# Patient Record
Sex: Female | Born: 1955 | Race: White | Hispanic: No | Smoking: Former smoker
Health system: Southern US, Community
[De-identification: ages and names within clinical notes are randomized; demographics above are authoritative.]

## PROBLEM LIST (undated history)

## (undated) DIAGNOSIS — I10 Essential (primary) hypertension: Secondary | ICD-10-CM

## (undated) DIAGNOSIS — R51 Headache: Secondary | ICD-10-CM

## (undated) DIAGNOSIS — R000 Tachycardia, unspecified: Secondary | ICD-10-CM

## (undated) DIAGNOSIS — F419 Anxiety disorder, unspecified: Secondary | ICD-10-CM

## (undated) HISTORY — DX: Headache: R51

## (undated) HISTORY — DX: Anxiety disorder, unspecified: F41.9

## (undated) HISTORY — PX: CHOLECYSTECTOMY: SHX55

## (undated) HISTORY — DX: Essential (primary) hypertension: I10

---

## 1999-05-14 ENCOUNTER — Other Ambulatory Visit: Admission: RE | Admit: 1999-05-14 | Discharge: 1999-05-14 | Payer: Self-pay | Admitting: Gynecology

## 2000-02-03 ENCOUNTER — Other Ambulatory Visit: Admission: RE | Admit: 2000-02-03 | Discharge: 2000-02-03 | Payer: Self-pay | Admitting: Gynecology

## 2000-09-28 ENCOUNTER — Encounter: Admission: RE | Admit: 2000-09-28 | Discharge: 2000-09-28 | Payer: Self-pay | Admitting: Gynecology

## 2000-09-28 ENCOUNTER — Encounter: Payer: Self-pay | Admitting: Gynecology

## 2000-12-01 ENCOUNTER — Encounter: Payer: Self-pay | Admitting: *Deleted

## 2000-12-01 ENCOUNTER — Emergency Department (HOSPITAL_COMMUNITY): Admission: EM | Admit: 2000-12-01 | Discharge: 2000-12-01 | Payer: Self-pay | Admitting: *Deleted

## 2000-12-02 ENCOUNTER — Other Ambulatory Visit: Admission: RE | Admit: 2000-12-02 | Discharge: 2000-12-02 | Payer: Self-pay | Admitting: Gynecology

## 2001-12-22 ENCOUNTER — Encounter: Payer: Self-pay | Admitting: Family Medicine

## 2001-12-22 ENCOUNTER — Encounter: Admission: RE | Admit: 2001-12-22 | Discharge: 2001-12-22 | Payer: Self-pay | Admitting: Family Medicine

## 2002-07-18 ENCOUNTER — Emergency Department (HOSPITAL_COMMUNITY): Admission: EM | Admit: 2002-07-18 | Discharge: 2002-07-18 | Payer: Self-pay | Admitting: Emergency Medicine

## 2002-07-18 ENCOUNTER — Encounter: Payer: Self-pay | Admitting: Emergency Medicine

## 2002-10-24 ENCOUNTER — Other Ambulatory Visit: Admission: RE | Admit: 2002-10-24 | Discharge: 2002-10-24 | Payer: Self-pay | Admitting: Obstetrics and Gynecology

## 2003-02-14 ENCOUNTER — Encounter: Payer: Self-pay | Admitting: Obstetrics and Gynecology

## 2003-02-14 ENCOUNTER — Encounter: Admission: RE | Admit: 2003-02-14 | Discharge: 2003-02-14 | Payer: Self-pay | Admitting: Obstetrics and Gynecology

## 2003-04-04 ENCOUNTER — Encounter: Payer: Self-pay | Admitting: Cardiology

## 2003-04-04 ENCOUNTER — Ambulatory Visit (HOSPITAL_COMMUNITY): Admission: RE | Admit: 2003-04-04 | Discharge: 2003-04-04 | Payer: Self-pay | Admitting: Cardiology

## 2004-01-03 ENCOUNTER — Ambulatory Visit (HOSPITAL_COMMUNITY): Admission: RE | Admit: 2004-01-03 | Discharge: 2004-01-03 | Payer: Self-pay | Admitting: Neurology

## 2005-04-08 ENCOUNTER — Encounter: Admission: RE | Admit: 2005-04-08 | Discharge: 2005-04-08 | Payer: Self-pay | Admitting: Obstetrics and Gynecology

## 2005-05-27 ENCOUNTER — Other Ambulatory Visit: Admission: RE | Admit: 2005-05-27 | Discharge: 2005-05-27 | Payer: Self-pay | Admitting: Dermatology

## 2006-07-20 ENCOUNTER — Ambulatory Visit: Payer: Self-pay | Admitting: Cardiology

## 2006-07-20 LAB — CONVERTED CEMR LAB
BUN: 15 mg/dL (ref 6–23)
CO2: 28 meq/L (ref 19–32)
Calcium: 9.5 mg/dL (ref 8.4–10.5)
Glucose, Bld: 114 mg/dL — ABNORMAL HIGH (ref 70–99)
Potassium: 3.6 meq/L (ref 3.5–5.1)
Sodium: 142 meq/L (ref 135–145)
TSH: 0.79 microintl units/mL (ref 0.35–5.50)

## 2006-07-27 ENCOUNTER — Encounter: Payer: Self-pay | Admitting: Cardiology

## 2006-07-27 ENCOUNTER — Ambulatory Visit: Payer: Self-pay

## 2006-11-24 ENCOUNTER — Encounter: Admission: RE | Admit: 2006-11-24 | Discharge: 2006-11-24 | Payer: Self-pay | Admitting: Obstetrics and Gynecology

## 2007-03-07 ENCOUNTER — Other Ambulatory Visit: Admission: RE | Admit: 2007-03-07 | Discharge: 2007-03-07 | Payer: Self-pay | Admitting: Obstetrics and Gynecology

## 2007-05-18 ENCOUNTER — Ambulatory Visit (HOSPITAL_COMMUNITY): Admission: RE | Admit: 2007-05-18 | Discharge: 2007-05-18 | Payer: Self-pay | Admitting: Family Medicine

## 2007-07-11 ENCOUNTER — Ambulatory Visit: Payer: Self-pay | Admitting: Cardiology

## 2007-08-14 ENCOUNTER — Emergency Department (HOSPITAL_COMMUNITY): Admission: EM | Admit: 2007-08-14 | Discharge: 2007-08-14 | Payer: Self-pay | Admitting: Emergency Medicine

## 2008-07-24 ENCOUNTER — Emergency Department (HOSPITAL_COMMUNITY): Admission: EM | Admit: 2008-07-24 | Discharge: 2008-07-24 | Payer: Self-pay | Admitting: Emergency Medicine

## 2009-01-04 ENCOUNTER — Telehealth: Payer: Self-pay | Admitting: Cardiology

## 2009-01-26 ENCOUNTER — Emergency Department (HOSPITAL_COMMUNITY): Admission: EM | Admit: 2009-01-26 | Discharge: 2009-01-26 | Payer: Self-pay | Admitting: Emergency Medicine

## 2009-01-29 ENCOUNTER — Encounter (INDEPENDENT_AMBULATORY_CARE_PROVIDER_SITE_OTHER): Payer: Self-pay | Admitting: *Deleted

## 2009-12-26 ENCOUNTER — Ambulatory Visit (HOSPITAL_COMMUNITY): Admission: RE | Admit: 2009-12-26 | Discharge: 2009-12-26 | Payer: Self-pay | Admitting: Gastroenterology

## 2010-01-27 ENCOUNTER — Encounter: Admission: RE | Admit: 2010-01-27 | Discharge: 2010-01-27 | Payer: Self-pay | Admitting: Family Medicine

## 2010-09-24 ENCOUNTER — Other Ambulatory Visit (HOSPITAL_COMMUNITY): Payer: Self-pay | Admitting: Family Medicine

## 2010-09-24 DIAGNOSIS — R519 Headache, unspecified: Secondary | ICD-10-CM

## 2010-09-27 LAB — URINE CULTURE: Colony Count: NO GROWTH

## 2010-09-27 LAB — DIFFERENTIAL
Basophils Relative: 0 % (ref 0–1)
Lymphocytes Relative: 17 % (ref 12–46)
Monocytes Relative: 10 % (ref 3–12)
Neutro Abs: 3.5 10*3/uL (ref 1.7–7.7)

## 2010-09-27 LAB — URINALYSIS, ROUTINE W REFLEX MICROSCOPIC
Nitrite: NEGATIVE
Protein, ur: NEGATIVE mg/dL

## 2010-09-27 LAB — CBC
HCT: 35.7 % — ABNORMAL LOW (ref 36.0–46.0)
Hemoglobin: 12.6 g/dL (ref 12.0–15.0)
MCHC: 35.4 g/dL (ref 30.0–36.0)
Platelets: 236 10*3/uL (ref 150–400)
RBC: 3.93 MIL/uL (ref 3.87–5.11)
RDW: 12.6 % (ref 11.5–15.5)

## 2010-09-27 LAB — COMPREHENSIVE METABOLIC PANEL
Albumin: 3.6 g/dL (ref 3.5–5.2)
Alkaline Phosphatase: 48 U/L (ref 39–117)
BUN: 12 mg/dL (ref 6–23)
CO2: 26 mEq/L (ref 19–32)
Chloride: 106 mEq/L (ref 96–112)
Creatinine, Ser: 0.66 mg/dL (ref 0.4–1.2)
Potassium: 3.6 mEq/L (ref 3.5–5.1)
Sodium: 137 mEq/L (ref 135–145)
Total Bilirubin: 0.5 mg/dL (ref 0.3–1.2)

## 2010-09-27 LAB — LIPASE, BLOOD: Lipase: 16 U/L (ref 11–59)

## 2010-10-02 ENCOUNTER — Ambulatory Visit (HOSPITAL_COMMUNITY): Payer: Self-pay

## 2010-11-04 NOTE — Assessment & Plan Note (Signed)
[facility]                            CARDIOLOGY OFFICE NOTE   NAME:Yvonne [name], Yvonne [name]                    MRN:          [id number]  DATE:07/11/2007                            DOB:          1955-07-22    PRIMARY CARE PHYSICIAN:  None.   REASON FOR PRESENTATION:  Evaluate patient with palpitations.   HISTORY OF PRESENT ILLNESS:  The patient is 55 years old.  When I last  saw her here last year, she was complaining of palpitations.  We  discussed avoiding caffeine completely and increasing her beta blocker;  however, she says that within two to three weeks of that visit, they all  went away.  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  I could not do this without seeing her.   She says now she is not having any palpitations.  She has no pre-syncope  or syncope.  She denies any chest discomfort, neck or arm discomfort.  She has no shortness of breath.  She is quite active.   PAST MEDICAL HISTORY:  1. Questionable TIA.  2. Palpitations.  3. Atypical chest pain.  4. Hypertension.  5. Anxiety disorder.  6. Previous tobacco use.   ALLERGIES:  HYDROCODONE.   CURRENT MEDICATIONS:  1. Metoprolol 50 mg twice daily.  2. Aspirin 81 mg daily.   REVIEW OF SYSTEMS:  As stated in the HPI and otherwise negative for  other systems.   PHYSICAL EXAMINATION:  GENERAL:  The patient is in no distress.  VITAL SIGNS:  Blood pressure 128/79, heart rate 72 and regular.  HEENT:  Eyes:  Unremarkable.  Pupils equal, round, reactive to light.  Fundi not visualized.  NECK:  No jugular venous distention at 45 degrees.  Carotid upstroke  brisk and symmetric.  No bruits, no thyromegaly.  LUNGS:  Clear to auscultation bilaterally.  CHEST:  Unremarkable.  HEART:  PMI not displaced or sustained.  S1 and S2 within normal limits.  No S3, no S4, no clicks, rubs or murmurs.  ABDOMEN:  Flat with positive bowel sounds, normal in frequency  and  pitch.  No bruits, no rebound, no guarding, no midline pulsatile mass,  no organomegaly.  SKIN:  No rashes.  EXTREMITIES:  With 2+ pulses, no edema.   ASSESSMENT/PLAN:  1. Palpitations:  The patient is no longer bothered by these.  No      further cardiovascular testing is suggested.  She will continue      with the beta blocker on a twice daily dosing.  2. Hypertension:  The blood pressure is well-controlled.  She is to      continue with the medications as listed.   FOLLOWUP:  She will come back to this clinic as needed.     Rollene Rotunda, MD, [facility]  Electronically Signed    JH/MedQ  DD: 07/11/2007  DT: 07/11/2007  Job #: [id number]

## 2010-11-07 ENCOUNTER — Other Ambulatory Visit (HOSPITAL_COMMUNITY): Payer: Self-pay | Admitting: Family Medicine

## 2010-11-07 ENCOUNTER — Ambulatory Visit (HOSPITAL_COMMUNITY)
Admission: RE | Admit: 2010-11-07 | Discharge: 2010-11-07 | Disposition: A | Discharge status: Home / Self Care | Payer: Self-pay | Source: Ambulatory Visit | Attending: Family Medicine | Admitting: Family Medicine

## 2010-11-07 DIAGNOSIS — R519 Headache, unspecified: Secondary | ICD-10-CM

## 2010-11-07 DIAGNOSIS — R51 Headache: Secondary | ICD-10-CM | POA: Insufficient documentation

## 2010-11-07 NOTE — Assessment & Plan Note (Signed)
[facility]                            CARDIOLOGY OFFICE NOTE   NAME:Yvonne [name]                    MRN:          [id number]  DATE:07/20/2006                            DOB:          06-Jun-1956    PRIMARY:  None.   REASON FOR PRESENTATION:  Evaluate patient with palpitations.   HISTORY OF PRESENT ILLNESS:  The patient is now 55 years old.  I have  not seen her in about 2-1/2 years.  She had a long history of  palpitations and chest discomfort.  She has been managed with beta-  blockers with success.  This really improved her palpitations.  She was  having these rarely up until about 2-3 weeks ago.  She has now been  having them daily.  She notices skipped beats.  These are not sustained  tachyarrhythmias, by her description.  There has been no presyncope or  syncope.  She notices them and they are quite bothersome.  They take her  breath away.  She had one episode of chest discomfort about a month ago  but otherwise does not get any chest pressure or neck discomfort.  She  has no PND or orthopnea.   Of note, the patient does drink one large caffeinated tea every day and  eats a lot of chocolate.   PAST MEDICAL HISTORY:  1. Questionable TIA.  2. Palpitations.  3. Atypical chest pain.  4. Hypertension.  5. Anxiety disorder.  6. Previous tobacco use.   ALLERGIES:  HYDROCODONE.   MEDICATIONS:  1. Metoprolol 50 mg b.i.d.  2. Aspirin 81 mg daily.   REVIEW OF SYSTEMS:  As stated in the HPI and otherwise negative for  other systems.   PHYSICAL EXAMINATION:  Patient is in no distress.  Blood pressure  134/89, heart rate 84 and regular, weight 142 pounds, body mass index  24.  HEENT:  Eyes unremarkable, pupils equal, round, react to light, fundi  not visualized, oral mucosa unremarkable.  NECK:  No jugular venous distention, wave form within normal limits,  carotid upstroke brisk and symmetric, no bruits, no thyromegaly.  LYMPHATICS:   No cervical, axillary or inguinal adenopathy.  LUNGS:  Clear to auscultation bilaterally.  BACK:  No costovertebral angle tenderness.  CHEST:  Unremarkable.  HEART:  The PMI not displaced or sustained, S1 and S2 within normal  limits, no S3, no S4, no murmurs.  ABDOMEN:  Flat, positive bowel sounds normal in frequency and pitch, no  bruits, no rebound, no guarding or midline pulse, no mass, no  hepatomegaly, no splenomegaly.  SKIN:  No rashes, no nodules.  EXTREMITIES:  Pulses 2+, no cyanosis, no clubbing, no edema.  NEURO:  Oriented to person, place and time, cranial nerves II-XII  grossly intact, motor grossly intact throughout.   EKG:  Sinus rhythm, premature ventricular complexes, axis within normal  limits, intervals within normal limits, no acute ST-T wave changes.   ASSESSMENT AND PLAN:  1. Palpitations.  The patient is having increased palpitations.  We      discussed this at great length.  She will stop all caffeine  including chocolate.  I am going to increase her metoprolol to 50      mg three times daily.  I am going to get blood work to include a      TSH, magnesium and CBC.  If these conservative measures do not work      the next step might be a calcium channel blocker and finally a      Class 1C agent.  I am going to check an echocardiogram to make sure      there has been no change in her cardiac function.  2. Followup.  I would like to see her back in about 1 month to follow      up on this.  She knows to come to the emergency room should she      have any increasing symptoms, presyncope or syncope.     Rollene Rotunda, MD, [facility]  Electronically Signed    JH/MedQ  DD: 07/20/2006  DT: 07/20/2006  Job #: [id number]

## 2011-08-09 ENCOUNTER — Emergency Department (HOSPITAL_COMMUNITY)
Admission: EM | Admit: 2011-08-09 | Discharge: 2011-08-09 | Disposition: A | Discharge status: Home / Self Care | Payer: Worker's Compensation | Attending: Emergency Medicine | Admitting: Emergency Medicine

## 2011-08-09 ENCOUNTER — Encounter (HOSPITAL_COMMUNITY): Payer: Self-pay

## 2011-08-09 DIAGNOSIS — R209 Unspecified disturbances of skin sensation: Secondary | ICD-10-CM | POA: Insufficient documentation

## 2011-08-09 DIAGNOSIS — L089 Local infection of the skin and subcutaneous tissue, unspecified: Secondary | ICD-10-CM

## 2011-08-09 DIAGNOSIS — L03019 Cellulitis of unspecified finger: Secondary | ICD-10-CM | POA: Insufficient documentation

## 2011-08-09 DIAGNOSIS — L02519 Cutaneous abscess of unspecified hand: Secondary | ICD-10-CM | POA: Insufficient documentation

## 2011-08-09 HISTORY — DX: Tachycardia, unspecified: R00.0

## 2011-08-09 MED ORDER — DOXYCYCLINE HYCLATE 100 MG PO TABS
100.0000 mg | ORAL_TABLET | Freq: Once | ORAL | Status: AC
  Administered 2011-08-09: 100 mg via ORAL
  Filled 2011-08-09: qty 1

## 2011-08-09 MED ORDER — IBUPROFEN 800 MG PO TABS
800.0000 mg | ORAL_TABLET | Freq: Once | ORAL | Status: AC
  Administered 2011-08-09: 800 mg via ORAL
  Filled 2011-08-09: qty 1

## 2011-08-09 MED ORDER — ALPRAZOLAM 0.5 MG PO TABS
0.5000 mg | ORAL_TABLET | Freq: Once | ORAL | Status: AC
  Administered 2011-08-09: 0.5 mg via ORAL
  Filled 2011-08-09: qty 1

## 2011-08-09 MED ORDER — DOXYCYCLINE HYCLATE 100 MG PO CAPS: 100.0000 mg | ORAL_CAPSULE | Freq: Two times a day (BID) | ORAL | Status: AC

## 2011-08-09 MED ORDER — TRAMADOL HCL 50 MG PO TABS: 50.0000 mg | ORAL_TABLET | Freq: Four times a day (QID) | ORAL | Status: AC | PRN

## 2011-08-09 MED ORDER — IBUPROFEN 600 MG PO TABS: 600.0000 mg | ORAL_TABLET | Freq: Four times a day (QID) | ORAL | Status: AC | PRN

## 2011-08-09 MED ORDER — LIDOCAINE HCL (PF) 2 % IJ SOLN
10.0000 mL | Freq: Once | INTRAMUSCULAR | Status: AC
  Administered 2011-08-09: 10 mL
  Filled 2011-08-09: qty 10

## 2011-08-09 NOTE — ED Notes (Signed)
Pt presents with right middle finger swelling and pain. Pt states she got a toothpick stuck in finger yesterday.

## 2011-08-09 NOTE — Discharge Instructions (Signed)
Skin Infections A skin infection usually develops as a result of disruption of the skin barrier.  CAUSES  A skin infection might occur following:  Trauma or an injury to the skin such as a cut or insect sting.   Inflammation (as in eczema).   Breaks in the skin between the toes (as in athlete's foot).   Swelling (edema).  SYMPTOMS  The legs are the most common site affected. Usually there is:  Redness.   Swelling.   Pain.   There may be red streaks in the area of the infection.  TREATMENT   Minor skin infections may be treated with topical antibiotics, but if the skin infection is severe, hospital care and intravenous (IV) antibiotic treatment may be needed.   Most often skin infections can be treated with oral antibiotic medicine as well as proper rest and elevation of the affected area until the infection improves.   If you are prescribed oral antibiotics, it is important to take them as directed and to take all the pills even if you feel better before you have finished all of the medicine.   You may apply warm compresses to the area for 20-30 minutes 4 times daily.  You might need a tetanus shot now if:  You have no idea when you had the last one.   You have never had a tetanus shot before.   Your wound had dirt in it.  If you need a tetanus shot and you decide not to get one, there is a rare chance of getting tetanus. Sickness from tetanus can be serious. If you get a tetanus shot, your arm may swell and become red and warm at the shot site. This is common and not a problem. SEEK MEDICAL CARE IF:  The pain and swelling from your infection do not improve within 2 days.  SEEK IMMEDIATE MEDICAL CARE IF:  You develop a fever, chills, or other serious problems.  Document Released: 07/16/2004 Document Revised: 02/18/2011 Document Reviewed: 05/28/2008 [facility] Patient Information 2012 [place], [place].   Take your next dose of antibiotics tomorrow morning.  Start warm Epsom  salt soaks for 15 minutes 3-4 times daily.

## 2011-08-09 NOTE — ED Notes (Signed)
Pt left without workers comp form, tried to explain to pt to wait for form but pt left anyway and refused to listen to nurse on waiting for form

## 2011-08-09 NOTE — ED Notes (Signed)
Pt here for puncture wound to tip of right middle finger, states that a toothpick puncture her finger when she bent down to clean, very swollen and inflamed, red in color, states pain is throbbing, pt had been picking at finger with nail clippers when nurse entered room

## 2011-08-09 NOTE — ED Provider Notes (Signed)
History     CSN: [id number]  Arrival date & time 08/09/11  1628   First MD Initiated Contact with Patient 08/09/11 1655      Chief Complaint  Patient presents with  . Finger Injury    (Consider location/radiation/quality/duration/timing/severity/associated sxs/prior treatment) Patient is a 56 y.o. female presenting with hand pain. The history is provided by the patient.  Hand Pain This is a new (Patients right middle finger tip was stuck by a toothpick yesterday when she reached under a trash compactor at the home she was cleaning.) problem. The current episode started yesterday. The problem occurs constantly. The problem has been unchanged. Associated symptoms include numbness. Pertinent negatives include no arthralgias, chest pain, congestion, fever, headaches, joint swelling or rash. Associated symptoms comments: Swelling,  Redness and pain.. The symptoms are aggravated by bending (palpating). She has tried nothing for the symptoms.    Past Medical History  Diagnosis Date  . Pneumothorax   . Tachycardia     Past Surgical History  Procedure Date  . Cholecystectomy     No family history on file.  History  Substance Use Topics  . Smoking status: Never Smoker   . Smokeless tobacco: Not on file  . Alcohol Use: No    OB History    Grav Para Term Preterm Abortions TAB SAB Ect Mult Living                  Review of Systems  Constitutional: Negative for fever.  HENT: Negative for congestion.   Eyes: Negative.   Respiratory: Negative for shortness of breath.   Cardiovascular: Negative for chest pain.  Gastrointestinal: Negative.   Genitourinary: Negative.   Musculoskeletal: Negative for joint swelling and arthralgias.  Skin: Positive for color change and wound. Negative for rash.  Neurological: Positive for numbness. Negative for light-headedness and headaches.       The fingertip feels slightly numb.  Hematological: Negative.   Psychiatric/Behavioral: Negative.       Allergies  Hydrocodone  Home Medications   Current Outpatient Rx  Name Route Sig Dispense Refill  . DOXYCYCLINE HYCLATE 100 MG PO CAPS Oral Take 1 capsule (100 mg total) by mouth 2 (two) times daily. 20 capsule 0  . IBUPROFEN 600 MG PO TABS Oral Take 1 tablet (600 mg total) by mouth every 6 (six) hours as needed for pain. 30 tablet 0  . TRAMADOL HCL 50 MG PO TABS Oral Take 1 tablet (50 mg total) by mouth every 6 (six) hours as needed for pain. 15 tablet 0    BP 140/82  Pulse 77  Temp(Src) 98.2 F (36.8 C) (Oral)  Resp 20  Ht 5\' 4"  (1.626 m)  Wt 140 lb (63.504 kg)  BMI 24.03 kg/m2  SpO2 100%  Physical Exam  Nursing note and vitals reviewed. Constitutional: She is oriented to person, place, and time. She appears well-developed and well-nourished.  HENT:  Head: Normocephalic and atraumatic.  Eyes: Conjunctivae are normal.  Neck: Normal range of motion.  Cardiovascular: Normal rate, regular rhythm and intact distal pulses.   Pulmonary/Chest: Effort normal.  Musculoskeletal: Normal range of motion.  Neurological: She is alert and oriented to person, place, and time.  Skin: Skin is warm and dry. There is erythema.       Small puncture wound right 3rd distal volar finger with edema and erythema.  No fluctuance.  TTP.  Tiny locus of yellow coloration beneath puncture, no drainage.  Psychiatric: She has a normal mood and affect.  ED Course  INCISION AND DRAINAGE Performed by: Cody Albus L Authorized by: Burgess Amor L Consent: Verbal consent obtained. Risks and benefits: risks, benefits and alternatives were discussed Consent given by: patient Time out: Immediately prior to procedure a "time out" was called to verify the correct patient, procedure, equipment, support staff and site/side marked as required. Type: abscess Anesthesia: digital block Local anesthetic: lidocaine 2% without epinephrine Anesthetic total: 3 ml Scalpel size: 11 Incision type: single straight  (3 mm incision through puncture site.) Complexity: simple Drainage: bloody and purulent Drainage amount: scant Wound treatment: wound left open Patient tolerance: Patient tolerated the procedure well with no immediate complications.   (including critical care time)  Labs Reviewed - No data to display No results found.   1. Finger infection       MDM  Doxycycline,  Tramadol.  Warm soaks.  Recheck by pcp in 2 days.        Candis Musa, PA 08/09/11 2102

## 2011-08-10 NOTE — ED Provider Notes (Signed)
Medical screening examination/treatment/procedure(s) were performed by non-physician practitioner and as supervising physician I was immediately available for consultation/collaboration.   Shelda Jakes, MD 08/10/11 [phone]

## 2012-10-17 ENCOUNTER — Telehealth: Payer: Self-pay | Admitting: Family Medicine

## 2012-10-17 NOTE — Telephone Encounter (Signed)
Patient has a place left breast that's swollen and painful, may be a lymph node, [facility] told patient we could order a diagnostic mammogram with ultrasound of the left breast.  If "OK", please fax order to [phone].  Patient is scheduled here Monday  10/24/12 with Eber Jones for a physical.

## 2012-10-17 NOTE — Telephone Encounter (Signed)
OK, plz order

## 2012-10-18 ENCOUNTER — Other Ambulatory Visit: Payer: Self-pay | Admitting: Family Medicine

## 2012-10-18 ENCOUNTER — Encounter: Payer: Self-pay | Admitting: *Deleted

## 2012-10-18 DIAGNOSIS — N63 Unspecified lump in unspecified breast: Secondary | ICD-10-CM

## 2012-10-18 DIAGNOSIS — N644 Mastodynia: Secondary | ICD-10-CM

## 2012-10-18 NOTE — Telephone Encounter (Signed)
[facility] aware [facility]

## 2012-10-18 NOTE — Telephone Encounter (Signed)
Order faxed to the [facility] center for diagnostic mammogram with ultrasound of left breast. [facility] to notify pt.

## 2012-10-19 ENCOUNTER — Telehealth: Payer: Self-pay | Admitting: Family Medicine

## 2012-10-19 NOTE — Telephone Encounter (Signed)
Patient left me a voicemail message requesting a referral to [facility].  I called her back and left her a voicemail message explaining that she would need to discuss that with Eber Jones on 10/24/12 @ her PE appointment, that Eber Jones MIGHT want to order labs or some type of scan depending on frequency, severity, and location of headaches, and that the specialist needs documentation to have for review.

## 2012-10-24 ENCOUNTER — Other Ambulatory Visit: Payer: Self-pay | Admitting: Nurse Practitioner

## 2012-10-24 ENCOUNTER — Ambulatory Visit (INDEPENDENT_AMBULATORY_CARE_PROVIDER_SITE_OTHER): Payer: BC Managed Care – PPO | Admitting: Nurse Practitioner

## 2012-10-24 ENCOUNTER — Encounter: Payer: Self-pay | Admitting: Nurse Practitioner

## 2012-10-24 VITALS — BP 122/80 | Ht 62.5 in | Wt 155.4 lb

## 2012-10-24 DIAGNOSIS — R5381 Other malaise: Secondary | ICD-10-CM

## 2012-10-24 DIAGNOSIS — Z124 Encounter for screening for malignant neoplasm of cervix: Secondary | ICD-10-CM

## 2012-10-24 DIAGNOSIS — Z79899 Other long term (current) drug therapy: Secondary | ICD-10-CM

## 2012-10-24 DIAGNOSIS — Z0000 Encounter for general adult medical examination without abnormal findings: Secondary | ICD-10-CM

## 2012-10-26 LAB — PAP IG W/ RFLX HPV ASCU

## 2012-10-27 ENCOUNTER — Encounter: Payer: Self-pay | Admitting: Nurse Practitioner

## 2012-10-27 DIAGNOSIS — F419 Anxiety disorder, unspecified: Secondary | ICD-10-CM | POA: Insufficient documentation

## 2012-10-27 DIAGNOSIS — I10 Essential (primary) hypertension: Secondary | ICD-10-CM | POA: Insufficient documentation

## 2012-10-27 NOTE — Progress Notes (Signed)
  Subjective:    Patient ID: Yvonne [name], female    DOB: September 22, 1955, 57 y.o.   MRN: [id number]  HPI presents for her wellness exam. Has had off-and-on menstrual cycles, did not have one for 3 months and then had one 2 months ago that lasted about 5 days with slightly heavy flow. Most the time her cycles are very light. Has not had an eye or dental exam in a while, just started back on her insurance. Is scheduled for a mammogram at the end of the week. Has not had her colonoscopy. No new sexual partners. No vaginal discharge pelvic pain. At end of visit patient requesting medication for worms. See previous notes. All of her cultures have been negative. This is been established as more of a psychological issue, medication refill was denied.    Review of Systems  Constitutional: Negative for activity change and appetite change.  Eyes: Negative for visual disturbance.  Respiratory: Negative for chest tightness and shortness of breath.   Cardiovascular: Negative for chest pain, palpitations and leg swelling.  Gastrointestinal: Negative for abdominal pain, diarrhea, constipation and abdominal distention.  Genitourinary: Negative for dysuria, urgency, frequency, vaginal discharge, difficulty urinating, menstrual problem and pelvic pain.  Psychiatric/Behavioral: Negative for sleep disturbance.       Objective:   Physical Exam  Constitutional: She is oriented to person, place, and time. She appears well-developed. No distress.  HENT:  Right Ear: External ear normal.  Left Ear: External ear normal.  Mouth/Throat: Oropharynx is clear and moist.  Neck: Normal range of motion. Neck supple. No tracheal deviation present. No thyromegaly present.  Cardiovascular: Normal rate, regular rhythm and normal heart sounds.  Exam reveals no gallop.   No murmur heard. Pulmonary/Chest: Effort normal and breath sounds normal.  Abdominal: Soft. She exhibits no distension. There is no tenderness.   Genitourinary: Vagina normal and uterus normal. No vaginal discharge found.  Musculoskeletal: She exhibits no edema.  Lymphadenopathy:    She has no cervical adenopathy.  Neurological: She is alert and oriented to person, place, and time.  Skin: Skin is warm and dry. No rash noted.  Psychiatric: She has a normal mood and affect. Her behavior is normal.   breast exam normal limit, no masses noted. Axilla no adenopathy. EGBUS normal. Vagina minimal cloudy discharge. No CMT. Uterus and adnexa normal limit and nontender.     Assessment & Plan:  Routine general medical examination at a health care facility - Plan: Lipid panel, Pap IG w/ reflex to HPV when ASC-U  Other malaise and fatigue - Plan: Basic metabolic panel, TSH, Vitamin D 25 hydroxy  Encounter for long-term (current) use of other medications - Plan: Hepatic function panel  Pap smear for cervical cancer screening - Plan: Pap IG w/ reflex to HPV when ASC-U  at this point recommended patient have a cycle at least every 3 months. To call us if she goes longer than 3 months without a menstrual cycle. Given information on colonoscopy so she can call to make her appointment. Strongly recommend an eye exam and dental care in the near future. Recheck 6 months, call back sooner if any problems.

## 2012-10-28 ENCOUNTER — Ambulatory Visit
Admission: RE | Admit: 2012-10-28 | Discharge: 2012-10-28 | Disposition: A | Discharge status: Home / Self Care | Payer: BC Managed Care – PPO | Source: Ambulatory Visit | Attending: Family Medicine | Admitting: Family Medicine

## 2012-10-28 DIAGNOSIS — N644 Mastodynia: Secondary | ICD-10-CM

## 2012-10-28 DIAGNOSIS — N63 Unspecified lump in unspecified breast: Secondary | ICD-10-CM

## 2012-10-31 LAB — LIPID PANEL: Cholesterol: 171 mg/dL (ref 0–200)

## 2012-10-31 LAB — HEPATIC FUNCTION PANEL
ALT: 16 U/L (ref 0–35)
AST: 20 U/L (ref 0–37)
Indirect Bilirubin: 0.7 mg/dL (ref 0.0–0.9)

## 2012-10-31 LAB — BASIC METABOLIC PANEL
BUN: 12 mg/dL (ref 6–23)
CO2: 28 mEq/L (ref 19–32)
Calcium: 9.6 mg/dL (ref 8.4–10.5)
Chloride: 103 mEq/L (ref 96–112)
Creat: 0.7 mg/dL (ref 0.50–1.10)
Potassium: 4.1 mEq/L (ref 3.5–5.3)
Sodium: 139 mEq/L (ref 135–145)

## 2012-11-01 ENCOUNTER — Encounter: Payer: Self-pay | Admitting: Family Medicine

## 2012-11-01 LAB — VITAMIN D 25 HYDROXY (VIT D DEFICIENCY, FRACTURES): Vit D, 25-Hydroxy: 38 ng/mL (ref 30–89)

## 2012-12-22 ENCOUNTER — Other Ambulatory Visit: Payer: Self-pay

## 2012-12-22 ENCOUNTER — Other Ambulatory Visit: Payer: Self-pay | Admitting: Family Medicine

## 2012-12-22 MED ORDER — ESTROGENS CONJUGATED 0.3 MG PO TABS: 0.3000 mg | ORAL_TABLET | Freq: Every day | ORAL | Status: DC

## 2013-01-06 ENCOUNTER — Encounter: Payer: Self-pay | Admitting: Family Medicine

## 2013-01-06 ENCOUNTER — Ambulatory Visit (INDEPENDENT_AMBULATORY_CARE_PROVIDER_SITE_OTHER): Payer: BC Managed Care – PPO | Admitting: Family Medicine

## 2013-01-06 VITALS — BP 132/80 | Temp 97.6°F | Wt 152.8 lb

## 2013-01-06 DIAGNOSIS — J329 Chronic sinusitis, unspecified: Secondary | ICD-10-CM

## 2013-01-06 MED ORDER — AZITHROMYCIN 250 MG PO TABS: ORAL_TABLET | ORAL | Status: DC

## 2013-01-06 NOTE — Progress Notes (Signed)
  Subjective:    Patient ID: Yvonne [name], female    DOB: 29-Oct-1955, 57 y.o.   MRN: [id number]  HPI Decreased energy weak  Persistent drainge, constant productive cough  achey all over  Feels weak,  No obvious fever. Low dose premarin .3 daily. Patient to one and to take it but it is too costly. Wonders if there are less expensive estrogens and would like one called and     Review of Systems No vomiting no diarrhea no change in bowel habits no tick bite ROS otherwise negative    Objective:   Physical Exam Alert talkative no acute distress. Lungs clear. Heart regular rate rhythm. HEENT significant nasal congestion frontal tenderness.       Assessment & Plan:  Impression post viral sinusitis/bronchitis. #2 estrogen supplement. Patient has severe hot flashes. Definitely wants to go on estrogen supplement. Aware there are side effects some serious. Plan Z-Pak. Will research weeks to expensive estrogens alternative. WSL

## 2013-01-09 ENCOUNTER — Other Ambulatory Visit: Payer: Self-pay | Admitting: *Deleted

## 2013-01-09 MED ORDER — ESTRADIOL 0.5 MG PO TABS: 0.5000 mg | ORAL_TABLET | Freq: Every day | ORAL | Status: DC

## 2013-01-23 ENCOUNTER — Telehealth: Payer: Self-pay | Admitting: Family Medicine

## 2013-01-23 NOTE — Telephone Encounter (Signed)
Referral sent to [facility], they call pt to set up, LMOVM to notify pt

## 2013-01-23 NOTE — Telephone Encounter (Signed)
I have no note of this since she was in for wellness checkup.  I think we covered a multitude of issues.  Pleasee refer to [facility] for headaches.  Thanks.

## 2013-01-23 NOTE — Telephone Encounter (Signed)
Pt left 2 voicemails for me on Friday 01/20/13 - wants referral to [facility] for "pain in head".  Pt states that she discussed with Eber Jones at her recent physical. Pt states she has requested this multiple times but we must have forgotten.  I didn't see anything in last couple office notes.  Please advise.  If "OK" please initiate in system so that I may proceed.

## 2013-02-24 ENCOUNTER — Ambulatory Visit (INDEPENDENT_AMBULATORY_CARE_PROVIDER_SITE_OTHER): Payer: BC Managed Care – PPO | Admitting: Neurology

## 2013-02-24 ENCOUNTER — Encounter: Payer: Self-pay | Admitting: Neurology

## 2013-02-24 VITALS — BP 145/89 | HR 71 | Ht 64.0 in | Wt 153.0 lb

## 2013-02-24 DIAGNOSIS — R519 Headache, unspecified: Secondary | ICD-10-CM | POA: Insufficient documentation

## 2013-02-24 DIAGNOSIS — R51 Headache: Secondary | ICD-10-CM

## 2013-02-24 HISTORY — DX: Headache: R51

## 2013-02-24 MED ORDER — TOPIRAMATE 25 MG PO TABS: ORAL_TABLET | ORAL | Status: DC

## 2013-02-24 NOTE — Progress Notes (Signed)
Reason for visit: Headache  Yvonne [name] is a 57 y.o. female  History of present illness:  Yvonne [name] is a 57 year old right-handed white female with a history of headaches off and on for most of her life. Approximately 3 years ago, the patient developed a different type of headache pain that is well localized to the left frontal and parietal area associated with scalp tenderness. The area of discomfort is small, about 2 inches across. The pain is sharp at times, and may last 10-20 minutes and then resolves. The patient has daily episodes. The patient does not have pain in other regions of her head, but she will note on occasion that she has some neck discomfort. The patient denies any numbness or tingling sensations on the face, or on the legs, but she will note that her hands will tingle at times. The patient denies weakness of the extremities, and she denies any problems controlling the bowels or the bladder. The balance is unaffected. The patient denies any pain into the eyes. Her usual headaches are bifrontal in nature, left or right side. The patient has not had any of her usual headaches recently. The patient reports no dizziness or nausea or vomiting with her current headache pain. The patient has undergone MRI evaluation in 2012, one year after onset of symptoms. This study was normal. MRA evaluation was done in 2008 and this was normal. The patient is sent to this office for further evaluation.  Past Medical History  Diagnosis Date  . Pneumothorax   . Tachycardia   . Hypertension   . Anxiety   . Headache(784.0) 02/24/2013    Past Surgical History  Procedure Laterality Date  . Cholecystectomy      Family History  Problem Relation Age of Onset  . Heart attack Father   . Migraines Father   . Heart attack Mother     Social history:  reports that she quit smoking about 24 years ago. Her smoking use included Cigarettes. She has a 21 pack-year smoking history. She has never  used smokeless tobacco. She reports that she does not drink alcohol or use illicit drugs.  Medications:  Current Outpatient Prescriptions on File Prior to Visit  Medication Sig Dispense Refill  . metoprolol (LOPRESSOR) 50 MG tablet take 1 tablet by mouth twice a day  60 tablet  5  . estradiol (ESTRACE) 0.5 MG tablet Take 1 tablet (0.5 mg total) by mouth daily.  30 tablet  11   No current facility-administered medications on file prior to visit.      Allergies  Allergen Reactions  . Hydrocodone Other (See Comments)    Chest pain    ROS:  Out of a complete 14 system review of symptoms, the patient complains only of the following symptoms, and all other reviewed systems are negative.  Trouble swallowing Itching [name] vision Easy bruising Feeling hot, flushing Headache, difficulty swallowing  Blood pressure 145/89, pulse 71, height 5\' 4"  (1.626 m), weight 153 lb (69.4 kg).  Physical Exam  General: The patient is alert and cooperative at the time of the examination.  Head: Pupils are equal, round, and reactive to light. Discs are flat bilaterally.  Neck: The neck is supple, no carotid bruits are noted.  Respiratory: The respiratory examination is clear.  Cardiovascular: The cardiovascular examination reveals a regular rate and rhythm, no obvious murmurs or rubs are noted.  Neuromuscular: Range of movement of the cervical spine is full and normal. The patient has some crepitus  in the left temporal knee the joint, not on the right.  Skin: Extremities are without significant edema.  Neurologic Exam  Mental status:  Cranial nerves: Facial symmetry is present. There is good sensation of the face to pinprick and soft touch bilaterally. The strength of the facial muscles and the muscles to head turning and shoulder shrug are normal bilaterally. Speech is well enunciated, no aphasia or dysarthria is noted. Extraocular movements are full. Visual fields are full.  Motor: The  motor testing reveals 5 over 5 strength of all 4 extremities. Good symmetric motor tone is noted throughout.  Sensory: Sensory testing is intact to pinprick, soft touch, vibration sensation, and position sense on all 4 extremities. No evidence of extinction is noted.  Coordination: Cerebellar testing reveals good finger-nose-finger and heel-to-shin bilaterally.  Gait and station: Gait is normal. Tandem gait is normal. Romberg is negative. No drift is seen.  Reflexes: Deep tendon reflexes are symmetric and normal bilaterally, with the exception that the ankle jerk reflexes are depressed. Toes are downgoing bilaterally.   Assessment/Plan:  1. Left sided headache  The patient is having an unusual headache pain with a well localized pain that comes and goes daily in the left parietal area. This is associated with scalp tenderness. This type of headache pain is not fully consistent with a migraine, or "ice pick pains". The patient has had normal brain MRI evaluation in the past, and this likely represents a benign headache pain. The patient will be given a trial on Topamax, and if this is not effective, a trial with a left occipital nerve injection may be done. The patient otherwise will followup in 3 or 4 months. Clinical examination is normal.  Marlan Palau MD 02/25/2013 3:39 PM  [facility] [address] [place], [place] [address]  Phone [phone] Fax [phone]

## 2013-04-24 ENCOUNTER — Telehealth: Payer: Self-pay | Admitting: Family Medicine

## 2013-04-24 MED ORDER — ESOMEPRAZOLE MAGNESIUM 40 MG PO PACK: 40.0000 mg | PACK | Freq: Every day | ORAL | Status: DC

## 2013-04-24 NOTE — Telephone Encounter (Signed)
nex 40 numb thirty one qd 11 ref

## 2013-04-24 NOTE — Telephone Encounter (Signed)
Patient needs Rx for nexium for heartburn/acid reflux  [facility]

## 2013-04-24 NOTE — Telephone Encounter (Signed)
Med sent to pharm. Pt notified.  

## 2013-04-28 ENCOUNTER — Other Ambulatory Visit: Payer: Self-pay | Admitting: *Deleted

## 2013-04-28 MED ORDER — ESOMEPRAZOLE MAGNESIUM 40 MG PO CPDR: 40.0000 mg | DELAYED_RELEASE_CAPSULE | Freq: Every day | ORAL | Status: DC

## 2013-05-24 ENCOUNTER — Telehealth: Payer: Self-pay | Admitting: Neurology

## 2013-05-24 NOTE — Telephone Encounter (Signed)
Please advise 

## 2013-05-24 NOTE — Telephone Encounter (Signed)
I called the patient, left a message, will call back later.

## 2013-05-25 ENCOUNTER — Telehealth: Payer: Self-pay | Admitting: *Deleted

## 2013-05-25 NOTE — Telephone Encounter (Signed)
I called the patient again, left a message. I will try to get the patient worked in a little bit sooner.

## 2013-05-25 NOTE — Telephone Encounter (Signed)
Dr. Anne Hahn would like to do a work in visit at 12:00 in the next 2 weeks. Any day

## 2013-05-30 ENCOUNTER — Ambulatory Visit (INDEPENDENT_AMBULATORY_CARE_PROVIDER_SITE_OTHER): Payer: BC Managed Care – PPO | Admitting: Neurology

## 2013-05-30 ENCOUNTER — Encounter: Payer: Self-pay | Admitting: Neurology

## 2013-05-30 ENCOUNTER — Encounter (INDEPENDENT_AMBULATORY_CARE_PROVIDER_SITE_OTHER): Payer: Self-pay

## 2013-05-30 VITALS — BP 141/87 | HR 71 | Wt 157.0 lb

## 2013-05-30 DIAGNOSIS — R51 Headache: Secondary | ICD-10-CM

## 2013-05-30 MED ORDER — GABAPENTIN 100 MG PO CAPS: ORAL_CAPSULE | ORAL | Status: DC

## 2013-05-30 NOTE — Patient Instructions (Signed)
   Neurontin (gabapentin) may result in drowsiness, ankle swelling, gait instability, or possibly dizziness. Please contact our office if significant side effects occur with this medication.  

## 2013-05-30 NOTE — Progress Notes (Signed)
Reason for visit: Headache  Yvonne [name] is an 57 y.o. female  History of present illness:  Yvonne [name] is a 57 year old right-handed white female with a history of an unusual headache that centers around the left parietal area. The patient has scalp tenderness in this region. At times, the patient will note a spreading of symptoms that includes tingling, numbness, and icy sensations that go to the top of the forehead, and to the back of the head. The patient does not note any neck discomfort or sensations into the tongue. The patient may have shooting and tingling pain at times. The patient denies any nausea or vomiting with the pain, and at times, she feels normal. The patient denies any numbness or weakness of the extremities, gait disturbance, or concentration issues. The patient reports no vision changes. The patient was given a trial on Topamax, but she stopped the medication as it was making her dizzy. The patient comes back into the office for further evaluation.  Past Medical History  Diagnosis Date  . Pneumothorax   . Tachycardia   . Hypertension   . Anxiety   . Headache(784.0) 02/24/2013    Past Surgical History  Procedure Laterality Date  . Cholecystectomy      Family History  Problem Relation Age of Onset  . Heart attack Father   . Migraines Father   . Heart attack Mother     Social history:  reports that she quit smoking about 24 years ago. Her smoking use included Cigarettes. She has a 21 pack-year smoking history. She has never used smokeless tobacco. She reports that she does not drink alcohol or use illicit drugs.    Allergies  Allergen Reactions  . Hydrocodone Other (See Comments)    Chest pain    Medications:  Current Outpatient Prescriptions on File Prior to Visit  Medication Sig Dispense Refill  . esomeprazole (NEXIUM) 40 MG capsule Take 1 capsule (40 mg total) by mouth daily.  30 capsule  11  . estradiol (ESTRACE) 0.5 MG tablet Take 1  tablet (0.5 mg total) by mouth daily.  30 tablet  11  . metoprolol (LOPRESSOR) 50 MG tablet take 1 tablet by mouth twice a day  60 tablet  5   No current facility-administered medications on file prior to visit.    ROS:  Out of a complete 14 system review of symptoms, the patient complains only of the following symptoms, and all other reviewed systems are negative.  Chills Left eye pain Palpitations of the heart Headache, numbness  Blood pressure 141/87, pulse 71, weight 157 lb (71.215 kg).  Physical Exam  General: The patient is alert and cooperative at the time of the examination.   Neuromuscular: Range of movement of the cervical spine is full.  Skin: No significant peripheral edema is noted.   Neurologic Exam  Mental status: The patient is oriented x 3.  Cranial nerves: Facial symmetry is present. Speech is normal, no aphasia or dysarthria is noted. Extraocular movements are full. Visual fields are full.  Motor: The patient has good strength in all 4 extremities.  Sensory examination: Soft sensation on the face, and hands is symmetric. The patient has no sensory alteration to pinprick or soft touch on the head.  Coordination: The patient has good finger-nose-finger and heel-to-shin bilaterally.  Gait and station: The patient has a normal gait. Tandem gait is normal. Romberg is negative. No drift is seen.  Reflexes: Deep tendon reflexes are symmetric.   Assessment/Plan:  1. Left sided head discomfort  The distribution of the discomfort seems to be in the C2 nerve root distribution. The patient has undergone MRI evaluation of the brain which is unremarkable. The patient will be set up for MRI of the cervical spine. We may consider possible cervical epidural steroid injections in the future. The patient will be placed on gabapentin in low dose, gradually increasing the dose. The patient will followup in 4-6 months.  Marlan Palau MD 05/30/2013 7:35 PM  [facility] [address] [place], [place] [address]  Phone [phone] Fax [phone]

## 2013-06-16 ENCOUNTER — Encounter: Payer: Self-pay | Admitting: Family Medicine

## 2013-06-16 ENCOUNTER — Ambulatory Visit (INDEPENDENT_AMBULATORY_CARE_PROVIDER_SITE_OTHER): Payer: BC Managed Care – PPO | Admitting: Family Medicine

## 2013-06-16 VITALS — BP 110/70 | Temp 98.5°F | Ht 64.0 in | Wt 158.2 lb

## 2013-06-16 DIAGNOSIS — J069 Acute upper respiratory infection, unspecified: Secondary | ICD-10-CM

## 2013-06-16 DIAGNOSIS — J019 Acute sinusitis, unspecified: Secondary | ICD-10-CM

## 2013-06-16 MED ORDER — AZITHROMYCIN 250 MG PO TABS: ORAL_TABLET | ORAL | Status: DC

## 2013-06-16 MED ORDER — ALPRAZOLAM 1 MG PO TABS: ORAL_TABLET | ORAL | Status: AC

## 2013-06-16 MED ORDER — BENZONATATE 100 MG PO CAPS: 100.0000 mg | ORAL_CAPSULE | Freq: Four times a day (QID) | ORAL | Status: DC | PRN

## 2013-06-16 NOTE — Progress Notes (Signed)
   Subjective:    Patient ID: Yvonne [name], female    DOB: May 09, 1956, 57 y.o.   MRN: [id number]  Cough This is a new problem. The current episode started in the past 7 days. The problem has been unchanged. The problem occurs constantly. The cough is productive of sputum. Associated symptoms include chills, myalgias, nasal congestion and a sore throat. Associated symptoms comments: Hoarse voice. Nothing aggravates the symptoms. She has tried OTC cough suppressant for the symptoms. The treatment provided no relief.   pmh benign   Review of Systems  Constitutional: Positive for chills.  HENT: Positive for sore throat.   Respiratory: Positive for cough.   Musculoskeletal: Positive for myalgias.   Some chills felt hot , notr taken temp No wheeze, bad cough past few days Some mucous    Objective:   Physical Exam  Nursing note and vitals reviewed. Constitutional: She appears well-developed.  HENT:  Head: Normocephalic.  Nose: Nose normal.  Mouth/Throat: Oropharynx is clear and moist. No oropharyngeal exudate.  Neck: Neck supple.  Cardiovascular: Normal rate and normal heart sounds.   No murmur heard. Pulmonary/Chest: Effort normal and breath sounds normal. She has no wheezes.  Lymphadenopathy:    She has no cervical adenopathy.  Skin: Skin is warm and dry.   Not toxic, no dyspnea        Assessment & Plan:  URI viral Acute sinusitis-Zithromax Tessalon prescribed warning signs discussed call if ongoing troubles

## 2013-07-03 ENCOUNTER — Telehealth: Payer: Self-pay | Admitting: Neurology

## 2013-07-03 DIAGNOSIS — R51 Headache: Secondary | ICD-10-CM

## 2013-07-03 NOTE — Telephone Encounter (Signed)
Wants to speak to someone about switching her MRI from a cervical MRI to a head. She states its on friday

## 2013-07-03 NOTE — Telephone Encounter (Signed)
I called the patient. The patient continues to report pain and discomfort in the back of the head. The patient had an MRI of the brain done in 2012 for headache that was normal. The patient was having headache and tingling sensations at the time of the MRI previously. The distribution of pain follows the C2 and C3 pattern. The patient apparently is paying for MRI out of pocket, and she does not want the MRI of the cervical spine. I will cancel the MRI of the cervical spine, and I will get the MRI of the brain.   MRI brain 09/24/2010:  IMPRESSION:  Negative unenhanced MRI the brain.

## 2013-07-04 ENCOUNTER — Ambulatory Visit: Payer: BC Managed Care – PPO | Admitting: Neurology

## 2013-07-07 ENCOUNTER — Inpatient Hospital Stay: Admission: RE | Admit: 2013-07-07 | Payer: Self-pay | Source: Ambulatory Visit

## 2013-07-13 ENCOUNTER — Ambulatory Visit
Admission: RE | Admit: 2013-07-13 | Discharge: 2013-07-13 | Disposition: A | Discharge status: Home / Self Care | Payer: BC Managed Care – PPO | Source: Ambulatory Visit | Attending: Neurology | Admitting: Neurology

## 2013-07-13 ENCOUNTER — Telehealth: Payer: Self-pay | Admitting: Family Medicine

## 2013-07-13 DIAGNOSIS — R51 Headache: Secondary | ICD-10-CM

## 2013-07-13 MED ORDER — CEFDINIR 300 MG PO CAPS: 300.0000 mg | ORAL_CAPSULE | Freq: Two times a day (BID) | ORAL | Status: DC

## 2013-07-13 NOTE — Telephone Encounter (Signed)
Pt states she is not any better, still having cough, sore throat, weakness  Wants to know if [facility] [place]

## 2013-07-13 NOTE — Telephone Encounter (Signed)
[facility] [place]  Patient notified.

## 2013-07-13 NOTE — Telephone Encounter (Signed)
Seen 06/16/13

## 2013-07-13 NOTE — Telephone Encounter (Signed)
omnicef 300 bid ten d 

## 2013-07-14 ENCOUNTER — Telehealth: Payer: Self-pay | Admitting: Neurology

## 2013-07-14 NOTE — Telephone Encounter (Signed)
I called patient. MRI the brain was normal. The patient will contact me if she cannot be improvement from the gabapentin that was started for the headache.   MRI brain 07/14/2013:  Impression   Normal MRI brain (without). No change from MRIs on 11/07/10, 05/18/07,  01/03/04 or 04/04/03.

## 2013-07-28 ENCOUNTER — Ambulatory Visit: Payer: BC Managed Care – PPO | Admitting: Family Medicine

## 2013-07-28 ENCOUNTER — Emergency Department (HOSPITAL_COMMUNITY): Payer: BC Managed Care – PPO

## 2013-07-28 ENCOUNTER — Telehealth: Payer: Self-pay | Admitting: *Deleted

## 2013-07-28 ENCOUNTER — Other Ambulatory Visit: Payer: Self-pay | Admitting: *Deleted

## 2013-07-28 ENCOUNTER — Emergency Department (HOSPITAL_COMMUNITY)
Admission: EM | Admit: 2013-07-28 | Discharge: 2013-07-28 | Disposition: A | Discharge status: Home / Self Care | Payer: BC Managed Care – PPO | Attending: Emergency Medicine | Admitting: Emergency Medicine

## 2013-07-28 ENCOUNTER — Encounter (HOSPITAL_COMMUNITY): Payer: Self-pay | Admitting: Emergency Medicine

## 2013-07-28 DIAGNOSIS — Z79899 Other long term (current) drug therapy: Secondary | ICD-10-CM | POA: Insufficient documentation

## 2013-07-28 DIAGNOSIS — R109 Unspecified abdominal pain: Secondary | ICD-10-CM

## 2013-07-28 DIAGNOSIS — Z8709 Personal history of other diseases of the respiratory system: Secondary | ICD-10-CM | POA: Insufficient documentation

## 2013-07-28 DIAGNOSIS — Z87891 Personal history of nicotine dependence: Secondary | ICD-10-CM | POA: Insufficient documentation

## 2013-07-28 DIAGNOSIS — R21 Rash and other nonspecific skin eruption: Secondary | ICD-10-CM | POA: Insufficient documentation

## 2013-07-28 DIAGNOSIS — Z8659 Personal history of other mental and behavioral disorders: Secondary | ICD-10-CM | POA: Insufficient documentation

## 2013-07-28 DIAGNOSIS — K5900 Constipation, unspecified: Secondary | ICD-10-CM

## 2013-07-28 DIAGNOSIS — Z9089 Acquired absence of other organs: Secondary | ICD-10-CM | POA: Insufficient documentation

## 2013-07-28 DIAGNOSIS — R509 Fever, unspecified: Secondary | ICD-10-CM | POA: Insufficient documentation

## 2013-07-28 DIAGNOSIS — R1031 Right lower quadrant pain: Secondary | ICD-10-CM | POA: Insufficient documentation

## 2013-07-28 DIAGNOSIS — I10 Essential (primary) hypertension: Secondary | ICD-10-CM | POA: Insufficient documentation

## 2013-07-28 DIAGNOSIS — Z7982 Long term (current) use of aspirin: Secondary | ICD-10-CM | POA: Insufficient documentation

## 2013-07-28 LAB — LIPASE, BLOOD: LIPASE: 33 U/L (ref 11–59)

## 2013-07-28 LAB — URINALYSIS, ROUTINE W REFLEX MICROSCOPIC
Bilirubin Urine: NEGATIVE
GLUCOSE, UA: NEGATIVE mg/dL
Hgb urine dipstick: NEGATIVE
KETONES UR: NEGATIVE mg/dL
LEUKOCYTES UA: NEGATIVE
NITRITE: NEGATIVE
PH: 5.5 (ref 5.0–8.0)
Protein, ur: NEGATIVE mg/dL
Specific Gravity, Urine: 1.01 (ref 1.005–1.030)
Urobilinogen, UA: 0.2 mg/dL (ref 0.0–1.0)

## 2013-07-28 LAB — CBC WITH DIFFERENTIAL/PLATELET
BASOS ABS: 0 10*3/uL (ref 0.0–0.1)
BASOS PCT: 0 % (ref 0–1)
Eosinophils Absolute: 0.1 10*3/uL (ref 0.0–0.7)
Eosinophils Relative: 2 % (ref 0–5)
HCT: 41.5 % (ref 36.0–46.0)
HEMOGLOBIN: 13.9 g/dL (ref 12.0–15.0)
LYMPHS PCT: 34 % (ref 12–46)
Lymphs Abs: 1.8 10*3/uL (ref 0.7–4.0)
MCH: 30.7 pg (ref 26.0–34.0)
MCHC: 33.5 g/dL (ref 30.0–36.0)
MCV: 91.6 fL (ref 78.0–100.0)
MONOS PCT: 6 % (ref 3–12)
Monocytes Absolute: 0.3 10*3/uL (ref 0.1–1.0)
NEUTROS ABS: 3 10*3/uL (ref 1.7–7.7)
NEUTROS PCT: 58 % (ref 43–77)
Platelets: 343 10*3/uL (ref 150–400)
RBC: 4.53 MIL/uL (ref 3.87–5.11)
RDW: 12.5 % (ref 11.5–15.5)
WBC: 5.2 10*3/uL (ref 4.0–10.5)

## 2013-07-28 LAB — HEPATIC FUNCTION PANEL
ALBUMIN: 4.1 g/dL (ref 3.5–5.2)
ALT: 14 U/L (ref 0–35)
AST: 20 U/L (ref 0–37)
Alkaline Phosphatase: 77 U/L (ref 39–117)
Bilirubin, Direct: <0.2 mg/dL (ref 0.0–0.3)
Total Bilirubin: 0.4 mg/dL (ref 0.3–1.2)
Total Protein: 7.4 g/dL (ref 6.0–8.3)

## 2013-07-28 LAB — BASIC METABOLIC PANEL
BUN: 17 mg/dL (ref 6–23)
CHLORIDE: 100 meq/L (ref 96–112)
CO2: 26 meq/L (ref 19–32)
Calcium: 9.8 mg/dL (ref 8.4–10.5)
Creatinine, Ser: 0.92 mg/dL (ref 0.50–1.10)
GFR calc non Af Amer: 69 mL/min — ABNORMAL LOW (ref >90)
GFR, EST AFRICAN AMERICAN: 80 mL/min — AB (ref >90)
Glucose, Bld: 98 mg/dL (ref 70–99)
POTASSIUM: 4.1 meq/L (ref 3.7–5.3)
Sodium: 139 mEq/L (ref 137–147)

## 2013-07-28 MED ORDER — POLYETHYLENE GLYCOL 3350 17 G PO PACK: 17.0000 g | PACK | Freq: Every day | ORAL | Status: DC

## 2013-07-28 MED ORDER — HYDROMORPHONE HCL PF 1 MG/ML IJ SOLN
0.5000 mg | Freq: Once | INTRAMUSCULAR | Status: AC
  Administered 2013-07-28: 0.5 mg via INTRAVENOUS
  Filled 2013-07-28: qty 1

## 2013-07-28 MED ORDER — SODIUM CHLORIDE 0.9 % IV SOLN
INTRAVENOUS | Status: DC
  Administered 2013-07-28: 18:00:00 via INTRAVENOUS

## 2013-07-28 MED ORDER — SODIUM CHLORIDE 0.9 % IJ SOLN
INTRAMUSCULAR | Status: AC
  Filled 2013-07-28: qty 350

## 2013-07-28 MED ORDER — IOHEXOL 300 MG/ML  SOLN
50.0000 mL | Freq: Once | INTRAMUSCULAR | Status: AC | PRN
  Administered 2013-07-28: 50 mL via ORAL

## 2013-07-28 MED ORDER — IOHEXOL 300 MG/ML  SOLN
100.0000 mL | Freq: Once | INTRAMUSCULAR | Status: AC | PRN
  Administered 2013-07-28: 100 mL via INTRAVENOUS

## 2013-07-28 MED ORDER — SODIUM CHLORIDE 0.9 % IV BOLUS (SEPSIS)
500.0000 mL | Freq: Once | INTRAVENOUS | Status: AC
  Administered 2013-07-28: 500 mL via INTRAVENOUS

## 2013-07-28 MED ORDER — ONDANSETRON HCL 4 MG/2ML IJ SOLN
4.0000 mg | Freq: Once | INTRAMUSCULAR | Status: AC
  Administered 2013-07-28: 4 mg via INTRAVENOUS
  Filled 2013-07-28: qty 2

## 2013-07-28 MED ORDER — TRAMADOL HCL 50 MG PO TABS: 50.0000 mg | ORAL_TABLET | Freq: Four times a day (QID) | ORAL | Status: DC | PRN

## 2013-07-28 NOTE — Discharge Instructions (Signed)
Abdominal Pain, Women °Abdominal (stomach, pelvic, or belly) pain can be caused by many things. It is important to tell your doctor: °· The location of the pain. °· Does it come and go or is it present all the time? °· Are there things that start the pain (eating certain foods, exercise)? °· Are there other symptoms associated with the pain (fever, nausea, vomiting, diarrhea)? °All of this is helpful to know when trying to find the cause of the pain. °CAUSES  °· Stomach: virus or bacteria infection, or ulcer. °· Intestine: appendicitis (inflamed appendix), regional ileitis (Crohn's disease), ulcerative colitis (inflamed colon), irritable bowel syndrome, diverticulitis (inflamed diverticulum of the colon), or cancer of the stomach or intestine. °· Gallbladder disease or stones in the gallbladder. °· Kidney disease, kidney stones, or infection. °· Pancreas infection or cancer. °· Fibromyalgia (pain disorder). °· Diseases of the female organs: °· Uterus: fibroid (non-cancerous) tumors or infection. °· Fallopian tubes: infection or tubal pregnancy. °· Ovary: cysts or tumors. °· Pelvic adhesions (scar tissue). °· Endometriosis (uterus lining tissue growing in the pelvis and on the pelvic organs). °· Pelvic congestion syndrome (female organs filling up with blood just before the menstrual period). °· Pain with the menstrual period. °· Pain with ovulation (producing an egg). °· Pain with an IUD (intrauterine device, birth control) in the uterus. °· Cancer of the female organs. °· Functional pain (pain not caused by a disease, may improve without treatment). °· Psychological pain. °· Depression. °DIAGNOSIS  °Your doctor will decide the seriousness of your pain by doing an examination. °· Blood tests. °· X-rays. °· Ultrasound. °· CT scan (computed tomography, special type of X-ray). °· MRI (magnetic resonance imaging). °· Cultures, for infection. °· Barium enema (dye inserted in the large intestine, to better view it with  X-rays). °· Colonoscopy (looking in intestine with a lighted tube). °· Laparoscopy (minor surgery, looking in abdomen with a lighted tube). °· Major abdominal exploratory surgery (looking in abdomen with a large incision). °TREATMENT  °The treatment will depend on the cause of the pain.  °· Many cases can be observed and treated at home. °· Over-the-counter medicines recommended by your caregiver. °· Prescription medicine. °· Antibiotics, for infection. °· Birth control pills, for painful periods or for ovulation pain. °· Hormone treatment, for endometriosis. °· Nerve blocking injections. °· Physical therapy. °· Antidepressants. °· Counseling with a psychologist or psychiatrist. °· Minor or major surgery. °HOME CARE INSTRUCTIONS  °· Do not take laxatives, unless directed by your caregiver. °· Take over-the-counter pain medicine only if ordered by your caregiver. Do not take aspirin because it can cause an upset stomach or bleeding. °· Try a clear liquid diet (broth or water) as ordered by your caregiver. Slowly move to a bland diet, as tolerated, if the pain is related to the stomach or intestine. °· Have a thermometer and take your temperature several times a day, and record it. °· Bed rest and sleep, if it helps the pain. °· Avoid sexual intercourse, if it causes pain. °· Avoid stressful situations. °· Keep your follow-up appointments and tests, as your caregiver orders. °· If the pain does not go away with medicine or surgery, you may try: °· Acupuncture. °· Relaxation exercises (yoga, meditation). °· Group therapy. °· Counseling. °SEEK MEDICAL CARE IF:  °· You notice certain foods cause stomach pain. °· Your home care treatment is not helping your pain. °· You need stronger pain medicine. °· You want your IUD removed. °· You feel faint or   lightheaded.  You develop nausea and vomiting.  You develop a rash.  You are having side effects or an allergy to your medicine. SEEK IMMEDIATE MEDICAL CARE IF:   Your  pain does not go away or gets worse.  You have a fever.  Your pain is felt only in portions of the abdomen. The right side could possibly be appendicitis. The left lower portion of the abdomen could be colitis or diverticulitis.  You are passing blood in your stools (bright red or black tarry stools, with or without vomiting).  You have blood in your urine.  You develop chills, with or without a fever.  You pass out. MAKE SURE YOU:   Understand these instructions.  Will watch your condition.  Will get help right away if you are not doing well or get worse. Document Released: 04/05/2007 Document Revised: 08/31/2011 Document Reviewed: 04/25/2009 [facility] Patient Information 2014 [employer] [place].  Bowel pain workup negative except for a some concern for increased constipation. Take MiraLAX as directed. Return for any new or worse symptoms. Followup with your doctor in the next few days if not better.

## 2013-07-28 NOTE — ED Notes (Signed)
Patient c/o right side abd pain that becomes worse at night. Patient describes pain as burning and pulsating. Patient unsure of any fevers. Patient denies any nausea, vomiting, diarrhea, or urinary symptoms.

## 2013-07-28 NOTE — ED Provider Notes (Signed)
CSN: [id number]     Arrival date & time 07/28/13  1532 History  This chart was scribed for Yvonne Jakes, MD by Leone Payor, ED Scribe. This patient was seen in room APA10/APA10 and the patient's care was started 4:58 PM.    Chief Complaint  Patient presents with  . Abdominal Pain    The history is provided by the patient. No language interpreter was used.    HPI Comments: Yvonne [name] is a 58 y.o. female who presents to the Emergency Department complaining of sudden onset, intermittent RLQ pain that began 1.5 weeks ago. She describes this pain as burning and pulsating. She rates it as 7/10 currently, 10/10 at max. She states this pain is worse at night. She reports subjective fever last night. Pt made an appointment to be seen by her PCP today who directed her to come to the ED. LNMP was 1 year ago. She denies nausea, vomiting, dysuria, hematuria.   PCP Dr. Lilyan Punt   Past Medical History  Diagnosis Date  . Pneumothorax   . Tachycardia   . Hypertension   . Anxiety   . Headache(784.0) 02/24/2013   Past Surgical History  Procedure Laterality Date  . Cholecystectomy     Family History  Problem Relation Age of Onset  . Heart attack Father   . Migraines Father   . Heart attack Mother    History  Substance Use Topics  . Smoking status: Former Smoker -- 1.00 packs/day for 21 years    Types: Cigarettes    Quit date: 06/22/1988  . Smokeless tobacco: Never Used     Comment: stopped a long time ago  . Alcohol Use: No   OB History   Grav Para Term Preterm Abortions TAB SAB Ect Mult Living   3 1 1  2     1      Review of Systems  Constitutional: Positive for fever (subjective) and chills.  HENT: Negative for rhinorrhea.   Eyes: Negative for visual disturbance.  Cardiovascular: Negative for leg swelling.  Gastrointestinal: Positive for abdominal pain (rlq). Negative for nausea, vomiting, diarrhea and constipation.  Genitourinary: Negative for dysuria and hematuria.   Musculoskeletal: Negative for back pain and neck pain.  Skin: Positive for rash.  Neurological: Negative for headaches.  Hematological: Does not bruise/bleed easily.  Psychiatric/Behavioral: Negative for confusion.    Allergies  Hydrocodone  Home Medications   Current Outpatient Rx  Name  Route  Sig  Dispense  Refill  . aspirin EC 81 MG tablet   Oral   Take 81 mg by mouth daily.         . calcium carbonate (OS-CAL - DOSED IN MG OF ELEMENTAL CALCIUM) 1250 MG tablet   Oral   Take 1 tablet by mouth daily.         . Cholecalciferol (VITAMIN D-3) 1000 UNITS CAPS   Oral   Take 1,000 Units by mouth daily.         Marland Kitchen esomeprazole (NEXIUM) 40 MG capsule   Oral   Take 1 capsule (40 mg total) by mouth daily.   30 capsule   11   . estradiol (ESTRACE) 0.5 MG tablet   Oral   Take 1 tablet (0.5 mg total) by mouth daily.   30 tablet   11   . Garlic 500 MG CAPS   Oral   Take 500 mg by mouth daily.          . metoprolol (LOPRESSOR) 50 MG  tablet   Oral   Take 50 mg by mouth 2 (two) times daily.         . Multiple Vitamin (MULTIVITAMIN) tablet   Oral   Take 1 tablet by mouth daily.         . polyethylene glycol (MIRALAX / GLYCOLAX) packet   Oral   Take 17 g by mouth daily.   7 each   0    BP 137/73  Pulse 72  Temp(Src) 97.8 F (36.6 C) (Oral)  Resp 18  Ht 5\' 3"  (1.6 m)  Wt 152 lb (68.947 kg)  BMI 26.93 kg/m2  SpO2 98% Physical Exam  Nursing note and vitals reviewed. Constitutional: She is oriented to person, place, and time. She appears well-developed and well-nourished.  HENT:  Head: Normocephalic and atraumatic.  Mouth/Throat: Mucous membranes are normal.  Cardiovascular: Normal rate, regular rhythm and normal heart sounds.   Pulmonary/Chest: Effort normal and breath sounds normal. No respiratory distress. She has no wheezes. She has no rales.  Abdominal: Soft. Bowel sounds are normal. She exhibits no distension. There is tenderness.  Tenderness  to palpation RLQ. Left abdomen is non tender.   Musculoskeletal: Normal range of motion. She exhibits no edema.  Neurological: She is alert and oriented to person, place, and time.  Skin: Skin is warm and dry.  Psychiatric: She has a normal mood and affect.    ED Course  Procedures (including critical care time)  DIAGNOSTIC STUDIES: Oxygen Saturation is 98% on RA, normal by my interpretation.    COORDINATION OF CARE: 5:03 PM Discussed treatment plan with pt at bedside and pt agreed to plan.   Labs Review Labs Reviewed  BASIC METABOLIC PANEL - Abnormal; Notable for the following:    GFR calc non Af Amer 69 (*)    GFR calc Af Amer 80 (*)    All other components within normal limits  URINALYSIS, ROUTINE W REFLEX MICROSCOPIC  CBC WITH DIFFERENTIAL  LIPASE, BLOOD  HEPATIC FUNCTION PANEL   Results for orders placed during the hospital encounter of 07/28/13  URINALYSIS, ROUTINE W REFLEX MICROSCOPIC      Result Value Range   Color, Urine YELLOW  YELLOW   APPearance CLEAR  CLEAR   Specific Gravity, Urine 1.010  1.005 - 1.030   pH 5.5  5.0 - 8.0   Glucose, UA NEGATIVE  NEGATIVE mg/dL   Hgb urine dipstick NEGATIVE  NEGATIVE   Bilirubin Urine NEGATIVE  NEGATIVE   Ketones, ur NEGATIVE  NEGATIVE mg/dL   Protein, ur NEGATIVE  NEGATIVE mg/dL   Urobilinogen, UA 0.2  0.0 - 1.0 mg/dL   Nitrite NEGATIVE  NEGATIVE   Leukocytes, UA NEGATIVE  NEGATIVE  CBC WITH DIFFERENTIAL      Result Value Range   WBC 5.2  4.0 - 10.5 K/uL   RBC 4.53  3.87 - 5.11 MIL/uL   Hemoglobin 13.9  12.0 - 15.0 g/dL   HCT 40.941.5  81.136.0 - 91.446.0 %   MCV 91.6  78.0 - 100.0 fL   MCH 30.7  26.0 - 34.0 pg   MCHC 33.5  30.0 - 36.0 g/dL   RDW 78.212.5  95.611.5 - 21.315.5 %   Platelets 343  150 - 400 K/uL   Neutrophils Relative % 58  43 - 77 %   Neutro Abs 3.0  1.7 - 7.7 K/uL   Lymphocytes Relative 34  12 - 46 %   Lymphs Abs 1.8  0.7 - 4.0 K/uL   Monocytes Relative  6  3 - 12 %   Monocytes Absolute 0.3  0.1 - 1.0 K/uL   Eosinophils  Relative 2  0 - 5 %   Eosinophils Absolute 0.1  0.0 - 0.7 K/uL   Basophils Relative 0  0 - 1 %   Basophils Absolute 0.0  0.0 - 0.1 K/uL  BASIC METABOLIC PANEL      Result Value Range   Sodium 139  137 - 147 mEq/L   Potassium 4.1  3.7 - 5.3 mEq/L   Chloride 100  96 - 112 mEq/L   CO2 26  19 - 32 mEq/L   Glucose, Bld 98  70 - 99 mg/dL   BUN 17  6 - 23 mg/dL   Creatinine, Ser 1.61  0.50 - 1.10 mg/dL   Calcium 9.8  8.4 - 09.6 mg/dL   GFR calc non Af Amer 69 (*) >90 mL/min   GFR calc Af Amer 80 (*) >90 mL/min  LIPASE, BLOOD      Result Value Range   Lipase 33  11 - 59 U/L  HEPATIC FUNCTION PANEL      Result Value Range   Total Protein 7.4  6.0 - 8.3 g/dL   Albumin 4.1  3.5 - 5.2 g/dL   AST 20  0 - 37 U/L   ALT 14  0 - 35 U/L   Alkaline Phosphatase 77  39 - 117 U/L   Total Bilirubin 0.4  0.3 - 1.2 mg/dL   Bilirubin, Direct <0.4  0.0 - 0.3 mg/dL   Indirect Bilirubin NOT CALCULATED  0.3 - 0.9 mg/dL    Imaging Review Ct Abdomen Pelvis W Contrast  07/28/2013   CLINICAL DATA:  Right lower quadrant abdominal pain.  EXAM: CT ABDOMEN AND PELVIS WITH CONTRAST  TECHNIQUE: Multidetector CT imaging of the abdomen and pelvis was performed using the standard protocol following bolus administration of intravenous contrast.  CONTRAST:  OMNIPAQUE IOHEXOL 300 MG/ML SOLN, 50mL OMNIPAQUE IOHEXOL 300 MG/ML SOLN  COMPARISON:  None.  FINDINGS: Lung bases show minimal dependent atelectasis bilaterally. Heart size normal. No pericardial effusion. Small hiatal hernia.  Liver is unremarkable. Mild intrahepatic and extrahepatic biliary ductal prominence is likely related to cholecystectomy. Adrenal glands are unremarkable. Sub cm low-attenuation lesions in the kidneys are too small to definitively characterize but statistically, likely represent cysts. Ureters are decompressed bilaterally. There may be a calcified phlebolith adjacent to the distal right ureter (image 64). Tiny lower pole left renal stone. Spleen,  pancreas, stomach and bowel, including appendix, are unremarkable. Note is made that there is a fair amount of stool in the colon.  Uterus and ovaries are visualized. Bladder is grossly unremarkable. No pathologically enlarged lymph nodes. Trace scattered atherosclerotic calcification of the arterial vasculature. No worrisome lytic or sclerotic lesions.  IMPRESSION: 1. No definite acute findings to explain the patient's pain, other than constipation. A phlebolith is favored over a distal right ureteral stone. Please see the above discussion. No evidence of appendicitis. 2. Small left renal stone.   Electronically Signed   By: Leanna Battles M.D.   On: 07/28/2013 19:26    EKG Interpretation   None       MDM   1. Abdominal pain   2. Constipation    Patient symptoms and urinalysis not consistent with a renal stone. Suspect that this is due to the constipation found on CT. Labs are completely normal. Will treat with MiraLAX. Patient will followup with her primary care Dr. She return here  for any newer worse symptoms.  I personally performed the services described in this documentation, which was scribed in my presence. The recorded information has been reviewed and is accurate.    Yvonne Jakes, MD 07/28/13 [phone]

## 2013-07-28 NOTE — Telephone Encounter (Signed)
Spoke with the patient. She c/o right sided abdominal pain. It has been burning and pulsating for a week and a half now. She has not taken her temperature. She states Advil does not help. With those symptoms, I told her it was better for her to go straight to the ER instead of waiting until 4:30 here. Pt verbalized understanding and was going to the ER to r/o appendicitis.

## 2013-08-14 ENCOUNTER — Other Ambulatory Visit: Payer: Self-pay | Admitting: Nurse Practitioner

## 2013-10-18 ENCOUNTER — Telehealth: Payer: Self-pay | Admitting: *Deleted

## 2013-10-18 ENCOUNTER — Telehealth: Payer: Self-pay | Admitting: Family Medicine

## 2013-10-18 NOTE — Telephone Encounter (Signed)
Spoke with patient and transferred her up front to make an OV for anxiety issues. Pt verbalized understanding.

## 2013-10-18 NOTE — Telephone Encounter (Signed)
Patient wants refill on xanax.If she needs appointment she is off on Friday .she wanted to let you know.

## 2013-10-18 NOTE — Telephone Encounter (Signed)
Spoke with patient and transferred her up front to make an OV for anxiety issues. Pt verbalized understanding.  

## 2013-10-20 ENCOUNTER — Telehealth: Payer: Self-pay | Admitting: *Deleted

## 2013-10-20 NOTE — Telephone Encounter (Signed)
rec to her to take the over the counter rees's pinworms treatments as needsed, let her know I am not going to prescribe the 500 dollar med into her --that would require identification of parasites via sample

## 2013-10-20 NOTE — Telephone Encounter (Signed)
FYI: see notes below.  She will be seeing you next Friday.  I told patient in order for us to prescribe her a parasite medication, she would HAVE to bring in a sample of the parasites. She said she would try. She has already tried the Reese's Pinworm OTC med.

## 2013-10-20 NOTE — Telephone Encounter (Signed)
This lady has a real problem with likely imaginary parasites. I need old chart.

## 2013-10-20 NOTE — Telephone Encounter (Signed)
[facility] said they do not have Vermox any more. She could take OTC Reese's Pinworm is all they have similar.

## 2013-10-20 NOTE — Telephone Encounter (Signed)
Patient calling to see if you would be able to see her or send in medication to get rid of the tiny, white worms that are inside her mouth? She said she has been seen here for this in the past, about 3 times. She is not having any symptoms, but she can see them in her mouth with a magnifying glass.

## 2013-10-23 NOTE — Telephone Encounter (Signed)
I agree with Yvonne CanalesSteve; this patient has delusional parasitosis. Will follow up with her at office visit.

## 2013-10-27 ENCOUNTER — Ambulatory Visit (INDEPENDENT_AMBULATORY_CARE_PROVIDER_SITE_OTHER): Payer: BC Managed Care – PPO | Admitting: Nurse Practitioner

## 2013-10-27 ENCOUNTER — Encounter: Payer: Self-pay | Admitting: Nurse Practitioner

## 2013-10-27 VITALS — BP 130/80 | Ht 64.0 in | Wt 162.0 lb

## 2013-10-27 DIAGNOSIS — F419 Anxiety disorder, unspecified: Secondary | ICD-10-CM

## 2013-10-27 DIAGNOSIS — F22 Delusional disorders: Secondary | ICD-10-CM

## 2013-10-27 DIAGNOSIS — F411 Generalized anxiety disorder: Secondary | ICD-10-CM

## 2013-10-27 DIAGNOSIS — F40298 Other specified phobia: Secondary | ICD-10-CM

## 2013-10-27 MED ORDER — ALPRAZOLAM 1 MG PO TABS: 1.0000 mg | ORAL_TABLET | Freq: Two times a day (BID) | ORAL | Status: DC | PRN

## 2013-10-31 ENCOUNTER — Encounter: Payer: Self-pay | Admitting: Nurse Practitioner

## 2013-10-31 ENCOUNTER — Other Ambulatory Visit: Payer: Self-pay | Admitting: Family Medicine

## 2013-10-31 DIAGNOSIS — F22 Delusional disorders: Secondary | ICD-10-CM | POA: Insufficient documentation

## 2013-10-31 NOTE — Progress Notes (Signed)
Subjective:  Presents for c/o continued off/on pinworm or worm infestation. This began over 2 years ago. Has had multiple cultures done all which were negative for parasites. Started in her mouth. Has long stringy white "worms" which come out sporadically from the lips and gums. Will sometimes "stick a head out then go back in". When asked if anyone else has seen this phenomenon, she is evasive and states her boyfriend has not noted it. Now has worms (she sometimes refers to them as "pinworms") in the vaginal and rectal area. No itching or burning. Wonders if they will eventually spread over her body into her brain. Provider may not see worms depending on if they are ready to come out. Describes as "long, white worms thicker than a thread". When asked if she could take pictures or video of removing them from her mouth, she was very evasive stating that she did not have a camera on her phone and her other camera was broken. She never would directly say if her boyfriend or others had a camera, kept changing the subject. At end of visit, ask for xanax to use only under extreme stress particularly in elevators or other areas where she has claustrophobia.   Objective:   BP 130/80  Ht '5\' 4"'  (1.626 m)  Wt 162 lb (73.483 kg)  BMI 27.79 kg/m2 NAD. Alert, oriented. Lips, oral mucosa and throat are clear. Defers other examination.  Assessment:  Problem List Items Addressed This Visit     Other   Anxiety - Primary   Relevant Medications      ALPRAZolam Duanne Moron) tablet    delusional parasitosis  Plan: agreed to look at material under microscope if patient applies to slide with clear tape. Any effort to discuss this as a delusion is met with great resistance. If is unlikely at this point that patient will be receptive to a psych referral. Meds ordered this encounter  Medications  . ALPRAZolam (XANAX) 1 MG tablet    Sig: Take 1 tablet (1 mg total) by mouth 2 (two) times daily as needed for anxiety.   Dispense:  30 tablet    Refill:  0    Order Specific Question:  Supervising Provider    Answer:  Mikey Kirschner [[id number]]   Addendum: patient brought in a slide with material applied with clear tape on 5/11. 2 long thin white thread-like pieces noted with no evidence of any parasites. Has had negative cultures in the past.

## 2013-11-27 ENCOUNTER — Ambulatory Visit: Payer: BC Managed Care – PPO | Admitting: Neurology

## 2014-01-14 ENCOUNTER — Other Ambulatory Visit: Payer: Self-pay | Admitting: Nurse Practitioner

## 2014-01-14 ENCOUNTER — Other Ambulatory Visit: Payer: Self-pay | Admitting: Family Medicine

## 2014-01-15 NOTE — Telephone Encounter (Signed)
Ok plus 2 monthly ref 

## 2014-04-23 ENCOUNTER — Encounter: Payer: Self-pay | Admitting: Nurse Practitioner

## 2014-04-30 ENCOUNTER — Other Ambulatory Visit: Payer: Self-pay | Admitting: Family Medicine

## 2014-05-19 ENCOUNTER — Other Ambulatory Visit: Payer: Self-pay | Admitting: Family Medicine

## 2014-07-05 ENCOUNTER — Telehealth: Payer: Self-pay | Admitting: Family Medicine

## 2014-07-05 ENCOUNTER — Other Ambulatory Visit: Payer: Self-pay | Admitting: Family Medicine

## 2014-07-05 NOTE — Telephone Encounter (Signed)
Patient is interested in getting the mg on her estradiol (ESTRACE) 0.5 MG tablet increased.  She uses this for her hot flashes and she said that she does not feel like this medication is working.  Please advise.  [facility]

## 2014-07-05 NOTE — Telephone Encounter (Signed)
Carolyn to see 

## 2014-07-06 NOTE — Telephone Encounter (Signed)
The higher the dose the more risk of serious side effects, pt will need to come in and have a discusion with the clinician who manages her preventive/ women's health Yvonne [name](Yvonne [name]) before even considering doing that

## 2014-07-06 NOTE — Telephone Encounter (Signed)
Discussed with patient. Patient stated she will call back to schedule appt after checking her work schedule.

## 2014-07-06 NOTE — Telephone Encounter (Signed)
Brett CanalesSteve, you were the last one to prescribe this. Wanted you to be aware that according to chart, she has not had a hysterectomy. You may want to consider adding progesterone to her regimen to avoid endometrial hyperplasia.

## 2014-07-17 ENCOUNTER — Other Ambulatory Visit: Payer: Self-pay | Admitting: Family Medicine

## 2014-07-24 ENCOUNTER — Encounter: Payer: Self-pay | Admitting: Family Medicine

## 2014-07-26 ENCOUNTER — Ambulatory Visit (INDEPENDENT_AMBULATORY_CARE_PROVIDER_SITE_OTHER): Payer: BLUE CROSS/BLUE SHIELD | Admitting: Nurse Practitioner

## 2014-07-26 ENCOUNTER — Telehealth: Payer: Self-pay | Admitting: Nurse Practitioner

## 2014-07-26 ENCOUNTER — Encounter: Payer: Self-pay | Admitting: Nurse Practitioner

## 2014-07-26 VITALS — BP 122/74 | Ht 64.0 in | Wt 159.0 lb

## 2014-07-26 DIAGNOSIS — F419 Anxiety disorder, unspecified: Secondary | ICD-10-CM

## 2014-07-26 DIAGNOSIS — Z1231 Encounter for screening mammogram for malignant neoplasm of breast: Secondary | ICD-10-CM

## 2014-07-26 DIAGNOSIS — I10 Essential (primary) hypertension: Secondary | ICD-10-CM

## 2014-07-26 MED ORDER — METOPROLOL TARTRATE 50 MG PO TABS: 50.0000 mg | ORAL_TABLET | Freq: Two times a day (BID) | ORAL | Status: DC

## 2014-07-26 MED ORDER — ALPRAZOLAM 1 MG PO TABS: 1.0000 mg | ORAL_TABLET | Freq: Every evening | ORAL | Status: DC | PRN

## 2014-07-26 NOTE — Telephone Encounter (Signed)
metoprolol (LOPRESSOR) 50 MG tablet   [facility]   Pt states they have not received this script, pt seen today An Eber JonesCarolyn stated she was going to send it in

## 2014-07-26 NOTE — Progress Notes (Signed)
Subjective:    Patient ID: Yvonne PasserLisa M Burrous, female    DOB: 06-24-1955, 59 y.o.   MRN: [id number]  HPI presents for her wellness exam. Same sexual partner. Hot flashes have been unbearable, no relief with Estrace. Had a skin cancer screening 2 months ago. Regular vision and dental exams. No vaginal bleeding. Has tried a half of a Xanax at bedtime for the past few days which helps her sleep and controls any pounding heart rate/anxiety.    Review of Systems  Constitutional: Negative for fever, activity change, appetite change and fatigue.  HENT: Positive for congestion and rhinorrhea. Negative for dental problem, ear pain, sinus pressure and sore throat.   Respiratory: Negative for cough, chest tightness, shortness of breath and wheezing.   Cardiovascular: Negative for chest pain.  Gastrointestinal: Negative for nausea, vomiting, abdominal pain, diarrhea, constipation and abdominal distention.  Genitourinary: Negative for dysuria, urgency, frequency, vaginal bleeding, vaginal discharge, enuresis, difficulty urinating, genital sores and pelvic pain.  Psychiatric/Behavioral: The patient is nervous/anxious.        Objective:   Physical Exam  Constitutional: She is oriented to person, place, and time. She appears well-developed. No distress.  HENT:  Right Ear: External ear normal.  Left Ear: External ear normal.  Mouth/Throat: Oropharynx is clear and moist.  Neck: Normal range of motion. Neck supple. No tracheal deviation present. No thyromegaly present.  Cardiovascular: Normal rate, regular rhythm and normal heart sounds.  Exam reveals no gallop.   No murmur heard. Pulmonary/Chest: Effort normal and breath sounds normal.  Abdominal: Soft. She exhibits no distension. There is no tenderness.  Genitourinary: Vagina normal and uterus normal. No vaginal discharge found.  External GU no rash or lesions. Vagina minimal thin white discharge. Cervix normal limit in appearance, no CMT. Bimanual  exam no tenderness or obvious masses. Rectal exam no masses, no stool for Hemoccult.  Musculoskeletal: She exhibits no edema.  Lymphadenopathy:    She has no cervical adenopathy.  Neurological: She is alert and oriented to person, place, and time.  Skin: Skin is warm and dry. No rash noted.  Psychiatric: She has a normal mood and affect. Her behavior is normal.  Vitals reviewed.  Breast exam: No masses, axillae no adenopathy.        Assessment & Plan:  Essential hypertension  Anxiety  Encounter for screening mammogram for breast cancer - Plan: CANCELED: MM DIGITAL SCREENING BILATERAL  Discussed importance of colonoscopy, patient given information so she can schedule. Also chooses to schedule her own mammogram. Because we will be switching labs next week, patient chooses to call back so orders can be sent in to new lab for her routine test. Meds ordered this encounter  Medications  . ALPRAZolam (XANAX) 1 MG tablet    Sig: Take 1 tablet (1 mg total) by mouth at bedtime as needed for anxiety or sleep. for anxiety    Dispense:  30 tablet    Refill:  5    Order Specific Question:  Supervising Provider    Answer:  Merlyn AlbertLUKING, WILLIAM S [[id number]]  . metoprolol (LOPRESSOR) 50 MG tablet    Sig: Take 1 tablet (50 mg total) by mouth 2 (two) times daily.    Dispense:  60 tablet    Refill:  5    Order Specific Question:  Supervising Provider    Answer:  Merlyn AlbertLUKING, WILLIAM S [[id number]]   Also a trial of progesterone cream 4% as directed. Stop Estrace use due to risk including unopposed estrogen. Call office  if any vaginal bleeding. Encouraged continued healthy diet, regular activity and daily vitamin D/calcium supplementation. Return in about 1 year (around 07/27/2015).

## 2014-07-26 NOTE — Telephone Encounter (Signed)
Med sent. Pt notified.  

## 2014-07-27 ENCOUNTER — Other Ambulatory Visit: Payer: Self-pay | Admitting: *Deleted

## 2014-07-27 DIAGNOSIS — I10 Essential (primary) hypertension: Secondary | ICD-10-CM

## 2014-07-27 DIAGNOSIS — Z13220 Encounter for screening for lipoid disorders: Secondary | ICD-10-CM

## 2014-08-07 ENCOUNTER — Ambulatory Visit (INDEPENDENT_AMBULATORY_CARE_PROVIDER_SITE_OTHER): Payer: BLUE CROSS/BLUE SHIELD | Admitting: Neurology

## 2014-08-07 ENCOUNTER — Encounter: Payer: Self-pay | Admitting: Neurology

## 2014-08-07 VITALS — BP 123/72 | HR 69 | Ht 63.0 in | Wt 163.4 lb

## 2014-08-07 DIAGNOSIS — G441 Vascular headache, not elsewhere classified: Secondary | ICD-10-CM

## 2014-08-07 NOTE — Patient Instructions (Signed)

## 2014-08-07 NOTE — Progress Notes (Signed)
Reason for visit: Headache  Yvonne [name] is an 59 y.o. female  History of present illness:  Yvonne [name] is a 59 year old right-handed white female with a history of chronic left sided headaches that are primarily in the left vertex/frontal area of the head with scalp tenderness. The patient will have intermittent numbness that includes the left top of the head, down to the occipital area including the left ear. The patient denies any neck pain or neck stiffness. In the past, trials on gabapentin and Topamax have not been beneficial. The patient could not tolerate the medications. She has some occasional blurred vision and some dizziness. She indicates that the discomfort is daily in nature. She has had MRI evaluation of the brain done less than one year ago that was unremarkable. She returns for an evaluation.  Past Medical History  Diagnosis Date  . Pneumothorax   . Tachycardia   . Hypertension   . Anxiety   . Headache(784.0) 02/24/2013    Past Surgical History  Procedure Laterality Date  . Cholecystectomy      Family History  Problem Relation Age of Onset  . Heart attack Father   . Migraines Father   . Heart attack Mother     Social history:  reports that she quit smoking about 26 years ago. Her smoking use included Cigarettes. She has a 21 pack-year smoking history. She has never used smokeless tobacco. She reports that she does not drink alcohol or use illicit drugs.    Allergies  Allergen Reactions  . Hydrocodone Other (See Comments)    Chest pain    Medications:  Current Outpatient Prescriptions on File Prior to Visit  Medication Sig Dispense Refill  . ALPRAZolam (XANAX) 1 MG tablet Take 1 tablet (1 mg total) by mouth at bedtime as needed for anxiety or sleep. for anxiety 30 tablet 5  . aspirin EC 81 MG tablet Take 81 mg by mouth daily.    . Cholecalciferol (VITAMIN D-3) 1000 UNITS CAPS Take 1,000 Units by mouth daily.    Marland Kitchen esomeprazole (NEXIUM) 40 MG  capsule Take 1 capsule (40 mg total) by mouth daily. 30 capsule 11  . Garlic 500 MG CAPS Take 500 mg by mouth daily.     . metoprolol (LOPRESSOR) 50 MG tablet Take 1 tablet (50 mg total) by mouth 2 (two) times daily. 60 tablet 5  . Multiple Vitamin (MULTIVITAMIN) tablet Take 1 tablet by mouth daily.     No current facility-administered medications on file prior to visit.    ROS:  Out of a complete 14 system review of symptoms, the patient complains only of the following symptoms, and all other reviewed systems are negative.  Excessive sweating Ear pain Eye pain, blurred vision Palpitations of the heart Dizziness, headache, numbness  Blood pressure 123/72, pulse 69, height  (1.6 m), weight 163 lb 6.4 oz (74.118 kg).  Physical Exam  General: The patient is alert and cooperative at the time of the examination.  Neuromuscular: Range of movement of the cervical spine is full.  Skin: No significant peripheral edema is noted.   Neurologic Exam  Mental status: The patient is oriented x 3.  Cranial nerves: Facial symmetry is present. Speech is normal, no aphasia or dysarthria is noted. Extraocular movements are full. Visual fields are full.  Motor: The patient has good strength in all 4 extremities.  Sensory examination: Soft touch sensation is symmetric on the face, arms, and legs.  Coordination: The patient has  good finger-nose-finger and heel-to-shin bilaterally.  Gait and station: The patient has a normal gait. Tandem gait is normal. Romberg is negative. No drift is seen.  Reflexes: Deep tendon reflexes are symmetric.   MRI brain 07/14/13:  IMPRESSION:  Normal MRI brain (without). No change from MRIs on 11/07/10, 05/18/07, 01/03/04 or 04/04/03.    Assessment/Plan:  1. Left-sided headache, numbness  The patient continues to have ongoing symptoms over the last 4 years. MRI evaluation of the brain has been unremarkable. I have recommended an occipital nerve block  today, the patient does not wish to pursue this at this time. The patient will contact our office in the future if she desires to pursue this avenue of treatment.  Marlan Palau. Keith Valaree Fresquez MD 08/07/2014 7:05 PM  [facility] [address] [place], [place] [id number]  Phone [phone] Fax [phone]

## 2015-01-15 ENCOUNTER — Ambulatory Visit: Payer: BLUE CROSS/BLUE SHIELD | Admitting: Nurse Practitioner

## 2015-01-16 ENCOUNTER — Encounter: Payer: Self-pay | Admitting: Nurse Practitioner

## 2015-01-16 ENCOUNTER — Ambulatory Visit (INDEPENDENT_AMBULATORY_CARE_PROVIDER_SITE_OTHER): Payer: BLUE CROSS/BLUE SHIELD | Admitting: Nurse Practitioner

## 2015-01-16 VITALS — BP 120/74 | Wt 148.0 lb

## 2015-01-16 DIAGNOSIS — F419 Anxiety disorder, unspecified: Secondary | ICD-10-CM

## 2015-01-16 DIAGNOSIS — R002 Palpitations: Secondary | ICD-10-CM | POA: Insufficient documentation

## 2015-01-16 DIAGNOSIS — R232 Flushing: Secondary | ICD-10-CM | POA: Insufficient documentation

## 2015-01-16 DIAGNOSIS — N951 Menopausal and female climacteric states: Secondary | ICD-10-CM | POA: Diagnosis not present

## 2015-01-16 MED ORDER — GABAPENTIN 100 MG PO CAPS: 100.0000 mg | ORAL_CAPSULE | Freq: Every day | ORAL | Status: DC

## 2015-01-16 MED ORDER — METOPROLOL TARTRATE 50 MG PO TABS: 50.0000 mg | ORAL_TABLET | Freq: Two times a day (BID) | ORAL | Status: DC

## 2015-01-16 MED ORDER — ALPRAZOLAM 1 MG PO TABS: 1.0000 mg | ORAL_TABLET | Freq: Every evening | ORAL | Status: DC | PRN

## 2015-01-16 NOTE — Progress Notes (Addendum)
Subjective:  Presents with complaints of severe hot flashes and night sweats occurring about every 10-15 minutes. Seems began progressively worse. No vaginal bleeding. Has not had a menstrual cycle in 2-3 years. Nonsmoker. No relief with progesterone cream low dose oral estrogen were multiple OTC supplements. Takes a half to one Xanax for anxiety as directed. Continues to have rare palpitations even with metoprolol. Xanax does help palpitations.  Objective:   BP 120/74 mmHg  Wt 148 lb (67.132 kg) NAD. Alert, oriented. Mildly anxious affect. Lungs clear. Heart regular rate rhythm.  Assessment:  Problem List Items Addressed This Visit      Other   Anxiety - Primary   Relevant Medications   ALPRAZolam (XANAX) 1 MG tablet   Hot flashes   Palpitations      Plan:  Meds ordered this encounter  Medications  . ALPRAZolam (XANAX) 1 MG tablet    Sig: Take 1 tablet (1 mg total) by mouth at bedtime as needed for anxiety or sleep. for anxiety    Dispense:  30 tablet    Refill:  5    Order Specific Question:  Supervising Provider    Answer:  Merlyn Albert [[id number]]  . metoprolol (LOPRESSOR) 50 MG tablet    Sig: Take 1 tablet (50 mg total) by mouth 2 (two) times daily.    Dispense:  60 tablet    Refill:  5    Order Specific Question:  Supervising Provider    Answer:  Merlyn Albert [[id number]]  . gabapentin (NEURONTIN) 100 MG capsule    Sig: Take 1 capsule (100 mg total) by mouth at bedtime.    Dispense:  30 capsule    Refill:  5    Order Specific Question:  Supervising Provider    Answer:  Merlyn Albert [[id number]]   Patient very anxious with difficulty keeping her focused. Also spent considerable length of time discussing options for hot flashes. Trial of low-dose Neurontin, if this does not work consider combination estrogen and progesterone therapy either in pill form or in patch. Patient agrees with this plan. Return in about 6 months (around 07/19/2015) for physical.

## 2015-06-07 ENCOUNTER — Telehealth: Payer: Self-pay | Admitting: Family Medicine

## 2015-06-07 ENCOUNTER — Ambulatory Visit: Payer: BLUE CROSS/BLUE SHIELD | Admitting: Family Medicine

## 2015-06-07 NOTE — Telephone Encounter (Signed)
Pt states she needs some antibiotic called in for her to combat What she thinks is MRSA spots on 4 places on her body   States she is at work an it is very difficult for her to get off to get here To see you. Says this is a re occuring issue that you should be aware of.  Advised we do not call in antibiotics but that I would send the message  [facility]

## 2015-06-07 NOTE — Telephone Encounter (Signed)
May also reach her at [phone]

## 2015-06-07 NOTE — Telephone Encounter (Signed)
Called patient and informed her per Dr.Steve Luking- that office visit is needed and we could see patient today. Patient verbalized understanding and was transferred to front desk to schedule an appointment for this afternoon.

## 2015-07-29 ENCOUNTER — Encounter: Payer: Self-pay | Admitting: Nurse Practitioner

## 2015-07-29 ENCOUNTER — Ambulatory Visit (INDEPENDENT_AMBULATORY_CARE_PROVIDER_SITE_OTHER): Payer: BLUE CROSS/BLUE SHIELD | Admitting: Nurse Practitioner

## 2015-07-29 ENCOUNTER — Ambulatory Visit (HOSPITAL_COMMUNITY)
Admission: RE | Admit: 2015-07-29 | Discharge: 2015-07-29 | Disposition: A | Discharge status: Home / Self Care | Payer: BLUE CROSS/BLUE SHIELD | Source: Ambulatory Visit | Attending: Nurse Practitioner | Admitting: Nurse Practitioner

## 2015-07-29 VITALS — BP 132/80 | Ht 64.0 in | Wt 153.0 lb

## 2015-07-29 DIAGNOSIS — R05 Cough: Secondary | ICD-10-CM | POA: Insufficient documentation

## 2015-07-29 DIAGNOSIS — Z124 Encounter for screening for malignant neoplasm of cervix: Secondary | ICD-10-CM

## 2015-07-29 DIAGNOSIS — R5383 Other fatigue: Secondary | ICD-10-CM | POA: Diagnosis not present

## 2015-07-29 DIAGNOSIS — R042 Hemoptysis: Secondary | ICD-10-CM | POA: Insufficient documentation

## 2015-07-29 DIAGNOSIS — Z13220 Encounter for screening for lipoid disorders: Secondary | ICD-10-CM | POA: Diagnosis not present

## 2015-07-29 DIAGNOSIS — Z111 Encounter for screening for respiratory tuberculosis: Secondary | ICD-10-CM

## 2015-07-29 DIAGNOSIS — N951 Menopausal and female climacteric states: Secondary | ICD-10-CM | POA: Diagnosis not present

## 2015-07-29 DIAGNOSIS — Z1151 Encounter for screening for human papillomavirus (HPV): Secondary | ICD-10-CM | POA: Diagnosis not present

## 2015-07-29 DIAGNOSIS — Z79899 Other long term (current) drug therapy: Secondary | ICD-10-CM

## 2015-07-29 DIAGNOSIS — R232 Flushing: Secondary | ICD-10-CM

## 2015-07-29 DIAGNOSIS — Z0000 Encounter for general adult medical examination without abnormal findings: Secondary | ICD-10-CM | POA: Diagnosis not present

## 2015-07-29 MED ORDER — METOPROLOL TARTRATE 50 MG PO TABS: 50.0000 mg | ORAL_TABLET | Freq: Two times a day (BID) | ORAL | Status: DC

## 2015-07-29 MED ORDER — CONJ ESTROG-MEDROXYPROGEST ACE 0.3-1.5 MG PO TABS: 1.0000 | ORAL_TABLET | Freq: Every day | ORAL | Status: DC

## 2015-07-29 MED ORDER — ALPRAZOLAM 1 MG PO TABS: 1.0000 mg | ORAL_TABLET | Freq: Every evening | ORAL | Status: DC | PRN

## 2015-07-29 NOTE — Progress Notes (Signed)
Subjective:    Patient ID: Yvonne [name], female    DOB: Apr 19, 1956, 60 y.o.   MRN: [id number]  HPI presents for her wellness exam. No vaginal bleeding or pelvic pain. Has severe hot flashes to the point she has to change her clothes. Same sexual partner. Due for dental and vision exams which may be a problem due to finances. Regular activity. Has small amount of bright red blood in her sputum for about 2 weeks but has resolved. No CP/ischemic type pain or SOB. No unexplained weight loss. Former smoker.     Review of Systems  Constitutional: Negative for fever, activity change, appetite change, fatigue and unexpected weight change.  HENT: Positive for congestion, postnasal drip and rhinorrhea. Negative for dental problem, ear pain, sinus pressure and sore throat.   Respiratory: Negative for cough, chest tightness, shortness of breath and wheezing.   Cardiovascular: Negative for chest pain.  Gastrointestinal: Negative for nausea, vomiting, abdominal pain, diarrhea, constipation and abdominal distention.  Genitourinary: Negative for dysuria, urgency, frequency, vaginal bleeding, vaginal discharge, enuresis, difficulty urinating, genital sores and pelvic pain.       Objective:   Physical Exam  Constitutional: She is oriented to person, place, and time. She appears well-developed. No distress.  HENT:  Right Ear: External ear normal.  Left Ear: External ear normal.  Mouth/Throat: Oropharynx is clear and moist.  TMs mild clear effusion. Pharynx non erythematous with cloudy PND noted.   Neck: Normal range of motion. Neck supple. No tracheal deviation present. No thyromegaly present.  Mild anterior cervical adenopathy.  Cardiovascular: Normal rate, regular rhythm and normal heart sounds.  Exam reveals no gallop.   No murmur heard. Pulmonary/Chest: Effort normal and breath sounds normal.  Abdominal: Soft. She exhibits no distension. There is no tenderness.  Genitourinary: Vagina normal  and uterus normal. No vaginal discharge found.  External GU: no rashes or lesions. Vagina: minimal white to clear mucoid discharge. No CMT. Bimanual exam: no tenderness or obvious masses. Rectal exam: no masses; no stool for hemoccult.   Musculoskeletal: She exhibits no edema.  Lymphadenopathy:    She has cervical adenopathy.  Neurological: She is alert and oriented to person, place, and time.  Skin: Skin is warm and dry. No rash noted.  Psychiatric: She has a normal mood and affect. Her behavior is normal.  Vitals reviewed. Breast exam: minimal fine nodularity; no masses; axillae no adenopathy.         Assessment & Plan:   Problem List Items Addressed This Visit      Other   Hot flashes    Other Visit Diagnoses    Routine general medical examination at a health care facility    -  Primary    Relevant Orders    Pap IG and HPV (high risk) DNA detection    Screening for cervical cancer        Relevant Orders    Pap IG and HPV (high risk) DNA detection    Screening for HPV (human papillomavirus)        Relevant Orders    Pap IG and HPV (high risk) DNA detection    Screening for tuberculosis        Relevant Orders    TB Skin Test (Completed)    Hemoptysis        Relevant Orders    DG Chest 2 View (Completed)    Other fatigue        Relevant Orders    CBC with  Differential/Platelet    Hepatic function panel    TSH    VITAMIN D 25 Hydroxy (Vit-D Deficiency, Fractures)    Screening for lipid disorders        Relevant Orders    Lipid panel    High risk medication use        Relevant Orders    Basic metabolic panel      Will restart low dose HRT per patient request. She verbalizes understanding of the risks associated with HRT including DVT and breast/ovarian cancer risk. Also, needs to take progesterone because of estrogen.  Given information on colonoscopy; her last one was over 10 years ago according to patient.  She plans to schedule her own mammogram in [place].    Chest xray ordered as a precaution although small amount of blood was most likely coming from PND. She insists on PPD even with chest xray due to her employer.  Meds ordered this encounter  Medications  . ALPRAZolam (XANAX) 1 MG tablet    Sig: Take 1 tablet (1 mg total) by mouth at bedtime as needed for anxiety or sleep. for anxiety    Dispense:  30 tablet    Refill:  5    Order Specific Question:  Supervising Provider    Answer:  Merlyn Albert [[id number]]  . metoprolol (LOPRESSOR) 50 MG tablet    Sig: Take 1 tablet (50 mg total) by mouth 2 (two) times daily.    Dispense:  60 tablet    Refill:  5    Order Specific Question:  Supervising Provider    Answer:  Merlyn Albert [[id number]]  . estrogen, conjugated,-medroxyprogesterone (PREMPRO) 0.3-1.5 MG tablet    Sig: Take 1 tablet by mouth daily.    Dispense:  30 tablet    Refill:  5    Order Specific Question:  Supervising Provider    Answer:  Merlyn Albert [[id number]]   Return in about 6 months (around 01/26/2016) for recheck.

## 2015-07-31 LAB — TB SKIN TEST
Induration: 0 mm
TB SKIN TEST: NEGATIVE

## 2015-08-01 LAB — CBC WITH DIFFERENTIAL/PLATELET
BASOS: 0 %
Basophils Absolute: 0 10*3/uL (ref 0.0–0.2)
EOS (ABSOLUTE): 0.2 10*3/uL (ref 0.0–0.4)
Eos: 4 %
Hematocrit: 41.3 % (ref 34.0–46.6)
Hemoglobin: 13.5 g/dL (ref 11.1–15.9)
IMMATURE GRANS (ABS): 0 10*3/uL (ref 0.0–0.1)
Immature Granulocytes: 0 %
LYMPHS ABS: 1.8 10*3/uL (ref 0.7–3.1)
LYMPHS: 43 %
MCH: 29.5 pg (ref 26.6–33.0)
MCHC: 32.7 g/dL (ref 31.5–35.7)
MCV: 90 fL (ref 79–97)
Monocytes Absolute: 0.4 10*3/uL (ref 0.1–0.9)
Monocytes: 10 %
NEUTROS ABS: 1.9 10*3/uL (ref 1.4–7.0)
Neutrophils: 43 %
PLATELETS: 336 10*3/uL (ref 150–379)
RBC: 4.58 x10E6/uL (ref 3.77–5.28)
RDW: 13 % (ref 12.3–15.4)
WBC: 4.3 10*3/uL (ref 3.4–10.8)

## 2015-08-01 LAB — HEPATIC FUNCTION PANEL
ALBUMIN: 4.4 g/dL (ref 3.5–5.5)
ALK PHOS: 88 IU/L (ref 39–117)
ALT: 14 IU/L (ref 0–32)
AST: 17 IU/L (ref 0–40)
BILIRUBIN TOTAL: 0.3 mg/dL (ref 0.0–1.2)
Bilirubin, Direct: 0.12 mg/dL (ref 0.00–0.40)
TOTAL PROTEIN: 6.7 g/dL (ref 6.0–8.5)

## 2015-08-01 LAB — LIPID PANEL
Chol/HDL Ratio: 2.8 ratio units (ref 0.0–4.4)
Cholesterol, Total: 212 mg/dL — ABNORMAL HIGH (ref 100–199)
HDL: 75 mg/dL (ref >39)
LDL CALC: 109 mg/dL — AB (ref 0–99)
Triglycerides: 140 mg/dL (ref 0–149)
VLDL CHOLESTEROL CAL: 28 mg/dL (ref 5–40)

## 2015-08-01 LAB — BASIC METABOLIC PANEL
BUN/Creatinine Ratio: 21 (ref 9–23)
BUN: 16 mg/dL (ref 6–24)
CALCIUM: 9.8 mg/dL (ref 8.7–10.2)
CO2: 26 mmol/L (ref 18–29)
CREATININE: 0.77 mg/dL (ref 0.57–1.00)
Chloride: 100 mmol/L (ref 96–106)
GFR calc Af Amer: 99 mL/min/{1.73_m2} (ref >59)
GFR, EST NON AFRICAN AMERICAN: 86 mL/min/{1.73_m2} (ref >59)
Glucose: 86 mg/dL (ref 65–99)
Potassium: 4.5 mmol/L (ref 3.5–5.2)
Sodium: 143 mmol/L (ref 134–144)

## 2015-08-01 LAB — TSH: TSH: 1.35 u[IU]/mL (ref 0.450–4.500)

## 2015-08-01 LAB — VITAMIN D 25 HYDROXY (VIT D DEFICIENCY, FRACTURES): VIT D 25 HYDROXY: 28.7 ng/mL — AB (ref 30.0–100.0)

## 2015-08-04 LAB — PAP IG AND HPV HIGH-RISK
HPV, high-risk: NEGATIVE
PAP Smear Comment: 0

## 2015-08-08 ENCOUNTER — Telehealth: Payer: Self-pay | Admitting: Family Medicine

## 2015-08-08 NOTE — Telephone Encounter (Signed)
Calling to get results to recent pap and labwork.

## 2015-08-12 NOTE — Telephone Encounter (Signed)
See result report on labs.

## 2016-02-02 ENCOUNTER — Other Ambulatory Visit: Payer: Self-pay | Admitting: Nurse Practitioner

## 2016-02-03 NOTE — Telephone Encounter (Signed)
Six mo ok 

## 2016-02-27 ENCOUNTER — Ambulatory Visit (INDEPENDENT_AMBULATORY_CARE_PROVIDER_SITE_OTHER): Payer: BLUE CROSS/BLUE SHIELD | Admitting: Nurse Practitioner

## 2016-02-27 ENCOUNTER — Encounter: Payer: Self-pay | Admitting: Nurse Practitioner

## 2016-02-27 VITALS — BP 138/90 | Temp 98.5°F | Ht 64.0 in | Wt 151.5 lb

## 2016-02-27 DIAGNOSIS — M6248 Contracture of muscle, other site: Secondary | ICD-10-CM

## 2016-02-27 DIAGNOSIS — R5383 Other fatigue: Secondary | ICD-10-CM | POA: Insufficient documentation

## 2016-02-27 DIAGNOSIS — M62838 Other muscle spasm: Secondary | ICD-10-CM

## 2016-02-27 MED ORDER — SULFAMETHOXAZOLE-TRIMETHOPRIM 800-160 MG PO TABS: 1.0000 | ORAL_TABLET | Freq: Two times a day (BID) | ORAL | 0 refills | Status: DC

## 2016-02-27 NOTE — Progress Notes (Signed)
Subjective:  Presents for c/o lethargy and fatigue for about 1 1/2 weeks. Chills x 2 d. No documented fever. Having some chills right now. No headache, rash or joint pain. No history of tick bite. 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 Takes Septra then her symptoms resolve. No abscesses but had two small pustules on right leg which are resolving.  C/o tight tender muscles left neck and upper back area for over a month.   Objective:   BP 138/90   Temp 98.5 F (36.9 C) (Oral)   Ht 5\' 4"  (1.626 m)   Wt 151 lb 8 oz (68.7 kg)   BMI 26.00 kg/m  NAD. Alert, oriented. TMs minimal clear effusion. Pharynx clear. Neck supple with mild adenopathy. Lungs clear. Heart RRR. Tight tender muscles noted along left neck and upper back area. One tiny pink papule right thigh area.   Assessment: Muscle spasms of head and/or neck  Other fatigue  Plan:  Meds ordered this encounter  Medications  . sulfamethoxazole-trimethoprim (BACTRIM DS,SEPTRA DS) 800-160 MG tablet    Sig: Take 1 tablet by mouth 2 (two) times daily.    Dispense:  20 tablet    Refill:  0    Order Specific Question:   Supervising Provider    Answer:   Merlyn AlbertLUKING, WILLIAM S [[id number]]   Trial of Bactrim. Patient agrees to call back early next week if no improvement for further work up, sooner if worse. Reviewed measures to help with muscle spasms.

## 2016-02-27 NOTE — Patient Instructions (Signed)
Icy hot smart relief TENS unit 

## 2016-03-19 DIAGNOSIS — M47812 Spondylosis without myelopathy or radiculopathy, cervical region: Secondary | ICD-10-CM | POA: Diagnosis not present

## 2016-04-13 DIAGNOSIS — M542 Cervicalgia: Secondary | ICD-10-CM | POA: Diagnosis not present

## 2016-05-04 DIAGNOSIS — M47812 Spondylosis without myelopathy or radiculopathy, cervical region: Secondary | ICD-10-CM | POA: Diagnosis not present

## 2016-05-08 DIAGNOSIS — M47892 Other spondylosis, cervical region: Secondary | ICD-10-CM | POA: Diagnosis not present

## 2016-05-21 DIAGNOSIS — M47892 Other spondylosis, cervical region: Secondary | ICD-10-CM | POA: Diagnosis not present

## 2016-05-27 DIAGNOSIS — M47892 Other spondylosis, cervical region: Secondary | ICD-10-CM | POA: Diagnosis not present

## 2016-07-12 ENCOUNTER — Other Ambulatory Visit: Payer: Self-pay | Admitting: Nurse Practitioner

## 2016-09-08 ENCOUNTER — Other Ambulatory Visit: Payer: Self-pay | Admitting: Family Medicine

## 2016-09-08 NOTE — Telephone Encounter (Signed)
Ok plus one ref needs o v

## 2016-11-07 ENCOUNTER — Other Ambulatory Visit: Payer: Self-pay | Admitting: Family Medicine

## 2017-01-19 ENCOUNTER — Telehealth: Payer: Self-pay | Admitting: Nurse Practitioner

## 2017-01-19 NOTE — Telephone Encounter (Signed)
Patient is requesting a low dose of Premarin for hot flashes.  She said she has about 25 a day.   [facility]

## 2017-01-20 NOTE — Telephone Encounter (Signed)
Pt called to check on the status of this  Pt says hot flashes are severe, debilitating, suffering day & night  Please advise

## 2017-01-21 ENCOUNTER — Other Ambulatory Visit: Payer: Self-pay | Admitting: Nurse Practitioner

## 2017-01-21 NOTE — Telephone Encounter (Signed)
She was given low dose Prempro at her physical in Feb 2017 for severe hot flashes. Any particular reason she stopped this?

## 2017-01-21 NOTE — Telephone Encounter (Signed)
Left message to return call 

## 2017-01-21 NOTE — Telephone Encounter (Signed)
Not sure if she remembers our conversation from before but she cannot take "unopposed" estrogen if she still has a uterus. According to the chart, she has not had a hysterectomy so she must take progesterone. (Let me know if that has changed.) So, if she wants estrogen, she must take progesterone to prevent the uterine lining from getting too thick which can cause problems. If she disagrees with this, please have her make an appointment with Brett CanalesSteve to discuss. Thanks.

## 2017-01-21 NOTE — Telephone Encounter (Addendum)
Patient states the medication made her very very sick-projectile vomiting. Patient would like to try low dose premarin-her mom takes this without any problems.

## 2017-02-02 NOTE — Telephone Encounter (Signed)
Left message to return call 

## 2017-02-02 NOTE — Telephone Encounter (Signed)
Patient notified Not sure if she remembers our conversation from before but she cannot take "unopposed" estrogen if she still has a uterus. According to the chart, she has not had a hysterectomy so she must take progesterone. (Let me know if that has changed.) So, if she wants estrogen, she must take progesterone to prevent the uterine lining from getting too thick which can cause problems. Patient verbalized understanding and will make an appointment with GYN to discuss further options.

## 2017-04-28 ENCOUNTER — Telehealth: Payer: Self-pay | Admitting: Family Medicine

## 2017-04-28 NOTE — Telephone Encounter (Signed)
Pt has appt here 06/09/17 for a physical, BP med check, & TB test  Will need refill on her metoprolol (LOPRESSOR) 50 MG tablet Will run out before scheduled appointment  Please advise & call pt when done     [facility]

## 2017-04-30 ENCOUNTER — Other Ambulatory Visit: Payer: Self-pay | Admitting: Nurse Practitioner

## 2017-04-30 MED ORDER — METOPROLOL TARTRATE 50 MG PO TABS: 50.0000 mg | ORAL_TABLET | Freq: Two times a day (BID) | ORAL | 1 refills | Status: DC

## 2017-04-30 NOTE — Telephone Encounter (Signed)
Tried calling-no answer.  

## 2017-04-30 NOTE — Telephone Encounter (Signed)
Sent in refills to cover her until her next appointment.

## 2017-04-30 NOTE — Telephone Encounter (Signed)
Patient is aware 

## 2017-06-09 ENCOUNTER — Encounter: Payer: Self-pay | Admitting: Nurse Practitioner

## 2017-06-09 ENCOUNTER — Ambulatory Visit (INDEPENDENT_AMBULATORY_CARE_PROVIDER_SITE_OTHER): Payer: BLUE CROSS/BLUE SHIELD | Admitting: Nurse Practitioner

## 2017-06-09 ENCOUNTER — Telehealth: Payer: Self-pay | Admitting: *Deleted

## 2017-06-09 VITALS — BP 120/70 | HR 76 | Ht 64.0 in | Wt 149.6 lb

## 2017-06-09 DIAGNOSIS — Z1211 Encounter for screening for malignant neoplasm of colon: Secondary | ICD-10-CM | POA: Diagnosis not present

## 2017-06-09 DIAGNOSIS — I10 Essential (primary) hypertension: Secondary | ICD-10-CM | POA: Diagnosis not present

## 2017-06-09 DIAGNOSIS — Z01419 Encounter for gynecological examination (general) (routine) without abnormal findings: Secondary | ICD-10-CM

## 2017-06-09 DIAGNOSIS — R232 Flushing: Secondary | ICD-10-CM

## 2017-06-09 DIAGNOSIS — Z111 Encounter for screening for respiratory tuberculosis: Secondary | ICD-10-CM

## 2017-06-09 MED ORDER — ESTRADIOL-NORETHINDRONE ACET 0.05-0.14 MG/DAY TD PTTW: 1.0000 | MEDICATED_PATCH | TRANSDERMAL | 12 refills | Status: DC

## 2017-06-09 MED ORDER — ALPRAZOLAM 1 MG PO TABS: ORAL_TABLET | ORAL | 0 refills | Status: DC

## 2017-06-09 MED ORDER — METOPROLOL TARTRATE 50 MG PO TABS: 50.0000 mg | ORAL_TABLET | Freq: Two times a day (BID) | ORAL | 5 refills | Status: DC

## 2017-06-09 NOTE — Telephone Encounter (Signed)
Pt seen today. States its very important to get refills on alprazolam and metoprolol for 6 months. She takes daily and does not want to run out. [facility] to [facility] [place].

## 2017-06-09 NOTE — Telephone Encounter (Signed)
Done

## 2017-06-10 ENCOUNTER — Encounter: Payer: Self-pay | Admitting: Nurse Practitioner

## 2017-06-10 NOTE — Progress Notes (Signed)
Subjective:    Patient ID: Yvonne PasserLisa M [name], female    DOB: October 09, 1955, 61 y.o.   MRN: [id number]  HPI presents for her wellness exam.  No vaginal bleeding.  Same sexual partner.  No pelvic pain.  Some pain with intercourse usually deeper inside the vagina.  Patient plans [place] own mamm[place] in [place], her last one was about a year and a half ago.  Very active including swimming and walking.  Healthy diet overall.  Continues to have severe hot flashes at least 20/day and 20 at nighttime.  Requesting restarting her estrogen.  Could not take Prempro due to extreme stomach upset and vomiting.    Review of Systems  Constitutional: Negative for activity change, appetite change and fatigue.  HENT: Negative for dental problem, ear pain, sinus pressure and sore throat.   Respiratory: Negative for cough, chest tightness, shortness of breath and wheezing.   Cardiovascular: Negative for chest pain.  Gastrointestinal: Negative for abdominal distention, abdominal pain, blood in stool, constipation, diarrhea, nausea and vomiting.  Genitourinary: Positive for dyspareunia. Negative for difficulty urinating, dysuria, enuresis, frequency, genital sores, pelvic pain, urgency, vaginal bleeding and vaginal discharge.       Objective:   Physical Exam  Constitutional: She is oriented to person, place, and time. She appears well-developed. No distress.  HENT:  Right Ear: External ear normal.  Left Ear: External ear normal.  Mouth/Throat: Oropharynx is clear and moist.  Neck: Normal range of motion. Neck supple. No tracheal deviation present. No thyromegaly present.  Cardiovascular: Normal rate, regular rhythm and normal heart sounds. Exam reveals no gallop.  No murmur heard. Pulmonary/Chest: Effort normal and breath sounds normal. Right breast exhibits no inverted nipple, no mass, no skin change and no tenderness. Left breast exhibits no inverted nipple, no mass, no skin change and no  tenderness. Breasts are symmetrical.  Axillae no adenopathy.  Abdominal: Soft. She exhibits no distension. There is no tenderness.  Genitourinary: Vagina normal and uterus normal. No vaginal discharge found.  Genitourinary Comments: External GU 1 small skin colored skin tag noted on the right labia.  No other lesions.  Vagina pale and slightly dry.  No lesions noted.  A tiny nodule palpated in the outer third of the vaginal wall, nontender to palpation.  Patient states this is been there well over a year and has actually gone down in size.  Nontender.  Bimanual exam no tenderness or obvious masses.  Musculoskeletal: She exhibits no edema.  Lymphadenopathy:    She has no cervical adenopathy.  Neurological: She is alert and oriented to person, place, and time.  Skin: Skin is warm and dry. No rash noted.  Psychiatric: She has a normal mood and affect. Her behavior is normal.  Vitals reviewed.         Assessment & Plan:   Problem List Items Addressed This Visit      Cardiovascular and Mediastinum   Hot flashes   Relevant Medications   metoprolol tartrate (LOPRESSOR) 50 MG tablet   Hypertension   Relevant Medications   metoprolol tartrate (LOPRESSOR) 50 MG tablet   Other Relevant Orders   Basic metabolic panel    Other Visit Diagnoses    Well woman exam    -  Primary   Relevant Orders   Lipid panel   Hepatic function panel   Basic metabolic panel   Hepatitis C antibody   IFOBT POC (occult bld, rslt in office)   Screening for tuberculosis  Relevant Orders   TB Skin Test (Completed)   Screen for colon cancer       Relevant Orders   IFOBT POC (occult bld, rslt in office)     Recommend skin cancer screening.  TB skin test today as required by her job.  Patient agrees to I FOBT, if positive agrees to consider colonoscopy.  No family history of colon cancer.  Patient to schedule her own mammogram as planned. Meds ordered this encounter  Medications  .  estradiol-norethindrone (COMBIPATCH) 0.05-0.14 MG/DAY    Sig: Place 1 patch onto the skin 2 (two) times a week.    Dispense:  8 patch    Refill:  12    Order Specific Question:   Supervising Provider    Answer:   Merlyn AlbertLUKING, WILLIAM S [[id number]]  . metoprolol tartrate (LOPRESSOR) 50 MG tablet    Sig: Take 1 tablet (50 mg total) by mouth 2 (two) times daily.    Dispense:  60 tablet    Refill:  5    Order Specific Question:   Supervising Provider    Answer:   Merlyn AlbertLUKING, WILLIAM S [[id number]]  . ALPRAZolam (XANAX) 1 MG tablet    Sig: take 1 tablet by mouth once daily if needed    Dispense:  30 tablet    Refill:  0    [facility] [place]    Order Specific Question:   Supervising Provider    Answer:   Merlyn AlbertLUKING, WILLIAM S [[id number]]   Start CombiPatch as directed.  Patient verbalizes understanding.  Again reviewed potential adverse effects of HRT including DVT and cancer risk.  Patient wishes to start hormone therapy due to the extreme nature of her hot flashes.  Explained that we cannot give her unopposed estrogen due to risk of endometrial cancer.  Call back if any vaginal bleeding. Return in about 1 year (around 06/09/2018) for physical.

## 2017-06-11 LAB — TB SKIN TEST
INDURATION: 0 mm
TB SKIN TEST: NEGATIVE

## 2017-08-27 ENCOUNTER — Other Ambulatory Visit: Payer: Self-pay | Admitting: Nurse Practitioner

## 2017-10-15 DIAGNOSIS — L299 Pruritus, unspecified: Secondary | ICD-10-CM | POA: Diagnosis not present

## 2017-10-15 DIAGNOSIS — D229 Melanocytic nevi, unspecified: Secondary | ICD-10-CM | POA: Diagnosis not present

## 2017-10-15 DIAGNOSIS — D485 Neoplasm of uncertain behavior of skin: Secondary | ICD-10-CM | POA: Diagnosis not present

## 2017-11-05 ENCOUNTER — Telehealth: Payer: Self-pay | Admitting: *Deleted

## 2017-11-05 MED ORDER — SULFAMETHOXAZOLE-TRIMETHOPRIM 800-160 MG PO TABS: 1.0000 | ORAL_TABLET | Freq: Two times a day (BID) | ORAL | 0 refills | Status: AC

## 2017-11-05 NOTE — Telephone Encounter (Signed)
I called and left a message asked that she r/c.I have sent in the antibx for [facility].

## 2017-11-05 NOTE — Telephone Encounter (Signed)
Pt.notified

## 2017-11-05 NOTE — Telephone Encounter (Signed)
Let her know normally we wont do this but since stuck at work and unable to come in, and starting to get flu like symtoms, we will this time call in bactrim ds bid ten d, if this gets worse need to go to [facility] or [facility], if does not improve needs to folow up her

## 2017-11-05 NOTE — Telephone Encounter (Signed)
Pt has a boil of upper thigh. 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 States it is painful. Bigger than a grape but not as big a golfball which it has been in the past. She has been having chills. Feels like she has the flu. Offered appt today at 2pm she declined because she was at work. Told her she needs to be seen. She states she has had septra called in before without being seen. Tried to convience pt to come in but she states she can come Monday. Told her we do not recommend waiting til then. She requested a message to the doctor.   [facility] [address].

## 2017-11-09 ENCOUNTER — Other Ambulatory Visit: Payer: Self-pay | Admitting: Nurse Practitioner

## 2017-11-09 NOTE — Telephone Encounter (Signed)
One mo ok, due six mo f u on htn abnd anxiety

## 2018-03-25 ENCOUNTER — Telehealth: Payer: Self-pay | Admitting: Family Medicine

## 2018-03-25 NOTE — Telephone Encounter (Signed)
Form in provider office to be reviewed for CombiPatch.

## 2018-03-29 ENCOUNTER — Telehealth: Payer: Self-pay | Admitting: Family Medicine

## 2018-03-29 NOTE — Telephone Encounter (Signed)
[employer] faxed over same paper that was filled out on 03/25/18 stating that there was missing information. Paper is filled out and in provider office for review and signature.

## 2018-04-05 ENCOUNTER — Telehealth: Payer: Self-pay | Admitting: *Deleted

## 2018-04-05 NOTE — Telephone Encounter (Signed)
Left pt a message to return call to let her know we got fax from [employer] and they approved combipatch rx from 03/28/18 - 03/26/21. [facility] [address] and they did not pick up so I faxed over the approval letter to them.

## 2018-04-12 ENCOUNTER — Telehealth: Payer: Self-pay | Admitting: Family Medicine

## 2018-04-12 NOTE — Telephone Encounter (Signed)
Patient is aware and states her medication is ready at the pharmacy as they have texted her so.

## 2018-04-12 NOTE — Telephone Encounter (Signed)
Tried to call patient to inform her that Combi patches were approved. Left message to return call

## 2018-04-12 NOTE — Telephone Encounter (Signed)
Patient is aware it was approved.

## 2018-04-13 ENCOUNTER — Encounter: Payer: Self-pay | Admitting: Family Medicine

## 2018-04-13 ENCOUNTER — Ambulatory Visit: Payer: BLUE CROSS/BLUE SHIELD | Admitting: Family Medicine

## 2018-04-13 VITALS — BP 138/80 | Temp 98.5°F | Ht 64.0 in | Wt 151.4 lb

## 2018-04-13 DIAGNOSIS — J329 Chronic sinusitis, unspecified: Secondary | ICD-10-CM | POA: Diagnosis not present

## 2018-04-13 DIAGNOSIS — J310 Chronic rhinitis: Secondary | ICD-10-CM

## 2018-04-13 MED ORDER — CEFDINIR 300 MG PO CAPS: 300.0000 mg | ORAL_CAPSULE | Freq: Two times a day (BID) | ORAL | 0 refills | Status: DC

## 2018-04-13 NOTE — Progress Notes (Signed)
Subjective:    Patient ID: Yvonne [name], female    DOB: [birth year], 62 y.o.   MRN: [id number]  Sore Throat   This is a new problem. Episode onset: 5 days. Associated symptoms include congestion and coughing. Associated symptoms comments: Sneezing, chills, weak feeling. She has tried nothing for the symptoms.   Started about a week ago  Coughing some  Sneezing s lot   Runny nose with sneezing   Clear discharge  initially                                                                                                                                                                 +++++++++++++++++++++++++++++++++++++++++++++++++++++++++++++++++++++++++++++++++++++++++++++++++++++++++++++                                                                                                                                                                                     +++++++                                                                                                                                                                                                                                                                                                                                                               ++++  Chills and weakness noted  Having difficult with exertion    Throat sore   Nasal disch mostly  Lear, a bit light yellow  Pos prod thick hlegm yellow and gunky   snneezing  Lot no heead ache    Review of Systems  HENT: Positive for congestion.   Respiratory: Positive for cough.        Objective:   Physical Exam  Alert, mild malaise. Hydration good Vitals stable. frontal/ maxillary tenderness evident  positive nasal congestion. pharynx normal neck supple  lungs clear/no crackles or wheezes. heart regular in rhythm       Assessment & Plan:  Impression rhinosinusitis likely post viral, discussed with patient. plan antibiotics prescribed. Questions answered. Symptomatic care discussed. warning signs discussed. WSL

## 2018-05-06 ENCOUNTER — Telehealth: Payer: Self-pay | Admitting: Family Medicine

## 2018-05-06 NOTE — Telephone Encounter (Signed)
Pt seen 04/13/18, pt states she finished her antibiotics & felt better for a couple days, now symptoms have returned  Achy, weak, runny nose, cough, chills  Wonders if we can call in another antibiotic?   Please advise & call pt when done     [facility]

## 2018-05-11 ENCOUNTER — Telehealth: Payer: Self-pay | Admitting: Family Medicine

## 2018-05-11 NOTE — Telephone Encounter (Signed)
I tried calling pt and the phone call was disconnected. I was calling to see if running a fever.

## 2018-05-11 NOTE — Telephone Encounter (Signed)
Please advise 

## 2018-05-11 NOTE — Telephone Encounter (Signed)
See other message from 05/11/18

## 2018-05-11 NOTE — Telephone Encounter (Signed)
Pt seen 04/13/18. Dx rhinosinusitis and prescribed omnicef 300mg . Left message to return call to get more info on her symptoms.

## 2018-05-11 NOTE — Telephone Encounter (Signed)
Pt seen Dr.Scott 04/13/2018 and was prescribed antibiotics, pt states she is no better and wondering if she could have another round of antibiotics. Advise.

## 2018-05-13 NOTE — Telephone Encounter (Signed)
Left message to return call 

## 2018-05-16 ENCOUNTER — Other Ambulatory Visit: Payer: Self-pay | Admitting: *Deleted

## 2018-05-16 MED ORDER — AMOXICILLIN-POT CLAVULANATE 875-125 MG PO TABS: 1.0000 | ORAL_TABLET | Freq: Two times a day (BID) | ORAL | 0 refills | Status: DC

## 2018-05-16 NOTE — Telephone Encounter (Signed)
Med sent to pharm. Pt notified on voicemail.  

## 2018-05-16 NOTE — Telephone Encounter (Signed)
Augmentin 875 mg 1 twice daily for 10 days take with a snack to lessen chance of intestinal side effects If ongoing trouble follow-up It is fine to take allergy medicine if necessary with this

## 2018-05-16 NOTE — Telephone Encounter (Signed)
Left message to return call 

## 2018-05-16 NOTE — Telephone Encounter (Signed)
Symptoms since being seen in November. finsihed omnicef. Did not knock out her symptoms. Pt states zpack usually work good for her. Sneezing, runny nose, coughing up clear to yellow phelm. Mostly sneezing. Pt states she feels weak from the coughing and sneezing. No fever, no trouble breathing. Feels some better. [facility] [address] [phone]. Can leave a detailed message on voicemail.

## 2018-05-31 ENCOUNTER — Other Ambulatory Visit: Payer: Self-pay | Admitting: Family Medicine

## 2018-06-01 ENCOUNTER — Other Ambulatory Visit: Payer: Self-pay | Admitting: *Deleted

## 2018-06-01 MED ORDER — METOPROLOL TARTRATE 50 MG PO TABS: 50.0000 mg | ORAL_TABLET | Freq: Two times a day (BID) | ORAL | 0 refills | Status: DC

## 2018-06-01 NOTE — Telephone Encounter (Signed)
Ok plus 5 monthly ref 

## 2018-06-20 ENCOUNTER — Ambulatory Visit (INDEPENDENT_AMBULATORY_CARE_PROVIDER_SITE_OTHER): Payer: BLUE CROSS/BLUE SHIELD | Admitting: Family Medicine

## 2018-06-20 ENCOUNTER — Encounter: Payer: Self-pay | Admitting: Family Medicine

## 2018-06-20 VITALS — BP 138/80 | Temp 99.2°F | Ht 64.0 in | Wt 146.0 lb

## 2018-06-20 DIAGNOSIS — J019 Acute sinusitis, unspecified: Secondary | ICD-10-CM

## 2018-06-20 DIAGNOSIS — R6889 Other general symptoms and signs: Secondary | ICD-10-CM

## 2018-06-20 MED ORDER — AMOXICILLIN-POT CLAVULANATE 875-125 MG PO TABS: 1.0000 | ORAL_TABLET | Freq: Two times a day (BID) | ORAL | 0 refills | Status: DC

## 2018-06-20 NOTE — Progress Notes (Signed)
   Subjective:    Patient ID: Yvonne PasserLisa M [name], female    DOB: [birth year], 62 y.o.   MRN: [id number]  Sinusitis  This is a new problem. Episode onset: 3 days. Associated symptoms include congestion, coughing, headaches and a sore throat. Pertinent negatives include no ear pain or shortness of breath. (Fever) Treatments tried: dayquil, advil.   Viral-like illness for a few days with low-grade fever body aches not feeling good but now with sinus pressure pain discomfort no wheezing or difficulty breathing PMH benign under a lot of stress was taking care of her mother who was dying  She will be going to her mother's funeral tomorrow   Review of Systems  Constitutional: Negative for activity change and fever.  HENT: Positive for congestion, rhinorrhea and sore throat. Negative for ear pain.   Eyes: Negative for discharge.  Respiratory: Positive for cough. Negative for shortness of breath and wheezing.   Cardiovascular: Negative for chest pain.  Neurological: Positive for headaches.       Objective:   Physical Exam Vitals signs and nursing note reviewed.  Constitutional:      Appearance: She is well-developed.  HENT:     Head: Normocephalic.     Nose: Nose normal.     Mouth/Throat:     Pharynx: No oropharyngeal exudate.  Neck:     Musculoskeletal: Neck supple.  Cardiovascular:     Rate and Rhythm: Normal rate.     Heart sounds: Normal heart sounds. No murmur.  Pulmonary:     Effort: Pulmonary effort is normal.     Breath sounds: Normal breath sounds. No wheezing.  Lymphadenopathy:     Cervical: No cervical adenopathy.  Skin:    General: Skin is warm and dry.           Assessment & Plan:  Viral syndrome Flulike illness Will take several days to turn the corner but then gradually get better  Mild sinus pressure and discomfort related to sinus infection recommend antibiotics warning signs discussed follow-up if ongoing troubles

## 2018-07-27 ENCOUNTER — Other Ambulatory Visit: Payer: Self-pay | Admitting: Family Medicine

## 2018-07-27 DIAGNOSIS — Z1231 Encounter for screening mammogram for malignant neoplasm of breast: Secondary | ICD-10-CM

## 2018-08-09 ENCOUNTER — Other Ambulatory Visit: Payer: Self-pay | Admitting: Family Medicine

## 2018-08-25 DIAGNOSIS — L570 Actinic keratosis: Secondary | ICD-10-CM | POA: Diagnosis not present

## 2018-08-25 DIAGNOSIS — D229 Melanocytic nevi, unspecified: Secondary | ICD-10-CM | POA: Diagnosis not present

## 2018-08-29 ENCOUNTER — Ambulatory Visit
Admission: RE | Admit: 2018-08-29 | Discharge: 2018-08-29 | Disposition: A | Discharge status: Home / Self Care | Payer: BLUE CROSS/BLUE SHIELD | Source: Ambulatory Visit | Attending: Family Medicine | Admitting: Family Medicine

## 2018-08-29 ENCOUNTER — Encounter: Payer: Self-pay | Admitting: Radiology

## 2018-08-29 DIAGNOSIS — Z1231 Encounter for screening mammogram for malignant neoplasm of breast: Secondary | ICD-10-CM | POA: Diagnosis not present

## 2018-08-31 ENCOUNTER — Other Ambulatory Visit: Payer: Self-pay | Admitting: Family Medicine

## 2018-08-31 DIAGNOSIS — R928 Other abnormal and inconclusive findings on diagnostic imaging of breast: Secondary | ICD-10-CM

## 2018-09-16 ENCOUNTER — Other Ambulatory Visit: Payer: Self-pay | Admitting: Family Medicine

## 2018-09-16 ENCOUNTER — Other Ambulatory Visit: Payer: Self-pay

## 2018-09-16 ENCOUNTER — Ambulatory Visit
Admission: RE | Admit: 2018-09-16 | Discharge: 2018-09-16 | Disposition: A | Discharge status: Home / Self Care | Payer: BLUE CROSS/BLUE SHIELD | Source: Ambulatory Visit | Attending: Family Medicine | Admitting: Family Medicine

## 2018-09-16 DIAGNOSIS — R921 Mammographic calcification found on diagnostic imaging of breast: Secondary | ICD-10-CM | POA: Diagnosis not present

## 2018-09-16 DIAGNOSIS — R928 Other abnormal and inconclusive findings on diagnostic imaging of breast: Secondary | ICD-10-CM

## 2018-12-20 ENCOUNTER — Other Ambulatory Visit: Payer: Self-pay

## 2018-12-20 ENCOUNTER — Other Ambulatory Visit: Payer: BLUE CROSS/BLUE SHIELD

## 2018-12-20 DIAGNOSIS — Z20822 Contact with and (suspected) exposure to covid-19: Secondary | ICD-10-CM

## 2018-12-20 DIAGNOSIS — R6889 Other general symptoms and signs: Secondary | ICD-10-CM | POA: Diagnosis not present

## 2018-12-27 LAB — NOVEL CORONAVIRUS, NAA: SARS-CoV-2, NAA: NOT DETECTED

## 2019-03-08 ENCOUNTER — Other Ambulatory Visit: Payer: Self-pay | Admitting: Family Medicine

## 2019-03-09 NOTE — Telephone Encounter (Signed)
Please schedule and route back to nurses 

## 2019-03-09 NOTE — Telephone Encounter (Signed)
Ok times one rec o v with me or carolyn

## 2019-03-09 NOTE — Telephone Encounter (Signed)
LVM to schedule appointment.

## 2019-03-14 NOTE — Telephone Encounter (Signed)
Patient scheduled phone visit on 9/28 .

## 2019-03-20 ENCOUNTER — Encounter: Payer: Self-pay | Admitting: Family Medicine

## 2019-03-20 ENCOUNTER — Other Ambulatory Visit: Payer: Self-pay

## 2019-03-20 ENCOUNTER — Ambulatory Visit (INDEPENDENT_AMBULATORY_CARE_PROVIDER_SITE_OTHER): Payer: BC Managed Care – PPO | Admitting: Family Medicine

## 2019-03-20 DIAGNOSIS — F5101 Primary insomnia: Secondary | ICD-10-CM | POA: Diagnosis not present

## 2019-03-20 DIAGNOSIS — I10 Essential (primary) hypertension: Secondary | ICD-10-CM

## 2019-03-20 MED ORDER — ALPRAZOLAM 1 MG PO TABS: ORAL_TABLET | ORAL | 5 refills | Status: DC

## 2019-03-20 MED ORDER — METOPROLOL TARTRATE 50 MG PO TABS: 50.0000 mg | ORAL_TABLET | Freq: Two times a day (BID) | ORAL | 5 refills | Status: DC

## 2019-03-20 NOTE — Progress Notes (Signed)
   Subjective:    Patient ID: Yvonne [name], female    DOB: [birth year], 63 y.o.   MRN: [id number]  Hypertension This is a chronic problem. The current episode started more than 1 year ago. Risk factors for coronary artery disease include post-menopausal state. Treatments tried: lopressor. There are no compliance problems.       Review of Systems  Virtual Visit via Video Note  I connected with Yvonne [name] on 03/20/19 at  2:00 PM EDT by a video enabled telemedicine application and verified that I am speaking with the correct person using two identifiers.  Location: Patient: home Provider: office   I discussed the limitations of evaluation and management by telemedicine and the availability of in person appointments. The patient expressed understanding and agreed to proceed.  History of Present Illness:    Observations/Objective:   Assessment and Plan:   Follow Up Instructions:    I discussed the assessment and treatment plan with the patient. The patient was provided an opportunity to ask questions and all were answered. The patient agreed with the plan and demonstrated an understanding of the instructions.   The patient was advised to call back or seek an in-person evaluation if the symptoms worsen or if the condition fails to improve as anticipated.  I provided 46minutes of non-face-to-face time during this encounter.   Patient compliant with insomnia medication. Generally takes most nights. No obvious morning drowsiness. Definitely helps patient sleep. Without it patient states would not get a good nights rest.  Blood pressure medicine and blood pressure levels reviewed today with patient. Compliant with blood pressure medicine. States does not miss a dose. No obvious side effects. Blood pressure generally good when checked elsewhere. Watching salt intake.    Does a fair amnt of ecercise  No headache, no major weight loss or weight gain, no chest pain no back  pain abdominal pain no change in bowel habits complete ROS otherwise negative      Objective:   Physical Exam  Virtual      Assessment & Plan:  Impression hypertension.  Apparent good control discussed to maintain same meds  2.  Insomnia/nighttime tachycardia.  Patient reports Xanax definitely helps this patient maintain  Diet exercise discussed

## 2019-04-12 DIAGNOSIS — M25551 Pain in right hip: Secondary | ICD-10-CM | POA: Diagnosis not present

## 2019-05-05 ENCOUNTER — Other Ambulatory Visit: Payer: Self-pay | Admitting: Family Medicine

## 2019-06-22 ENCOUNTER — Other Ambulatory Visit: Payer: Self-pay | Admitting: Family Medicine

## 2019-06-22 NOTE — Telephone Encounter (Signed)
6 mo ok 

## 2019-07-27 ENCOUNTER — Encounter: Payer: Self-pay | Admitting: Family Medicine

## 2019-11-01 ENCOUNTER — Other Ambulatory Visit: Payer: Self-pay | Admitting: Family Medicine

## 2019-11-01 NOTE — Telephone Encounter (Signed)
Scheduled 5/18. Pt also states she requested refill on metoprolol tartrate (LOPRESSOR) 50 MG tablet

## 2019-11-01 NOTE — Telephone Encounter (Signed)
Please contact patient to set up appointment; then may send back to nurses. Thank you!

## 2019-11-02 NOTE — Telephone Encounter (Signed)
Ok one both

## 2019-11-07 ENCOUNTER — Telehealth: Payer: Self-pay | Admitting: *Deleted

## 2019-11-07 ENCOUNTER — Other Ambulatory Visit: Payer: Self-pay

## 2019-11-07 ENCOUNTER — Telehealth (INDEPENDENT_AMBULATORY_CARE_PROVIDER_SITE_OTHER): Payer: 59 | Admitting: Family Medicine

## 2019-11-07 DIAGNOSIS — I10 Essential (primary) hypertension: Secondary | ICD-10-CM | POA: Diagnosis not present

## 2019-11-07 DIAGNOSIS — F5101 Primary insomnia: Secondary | ICD-10-CM | POA: Diagnosis not present

## 2019-11-07 MED ORDER — ALPRAZOLAM 1 MG PO TABS: ORAL_TABLET | ORAL | 5 refills | Status: DC

## 2019-11-07 MED ORDER — METOPROLOL TARTRATE 50 MG PO TABS: 50.0000 mg | ORAL_TABLET | Freq: Two times a day (BID) | ORAL | 5 refills | Status: DC

## 2019-11-07 NOTE — Progress Notes (Signed)
   Subjective:  Audio video  Patient ID: Yvonne [name], female    DOB: [birth year], 64 y.o.   MRN: [id number]  HPImed check up. Takes xanax for anxiety. Pt states no concerns today.  Gad 7 done.   Virtual Visit via Telephone Note  I connected with Yvonne [name] on 11/07/19 at  3:30 PM EDT by telephone and verified that I am speaking with the correct person using two identifiers.  Location: Patient: home Provider: office   I discussed the limitations, risks, security and privacy concerns of performing an evaluation and management service by telephone and the availability of in person appointments. I also discussed with the patient that there may be a patient responsible charge related to this service. The patient expressed understanding and agreed to proceed.   History of Present Illness:    Observations/Objective:   Assessment and Plan:   Follow Up Instructions:    I discussed the assessment and treatment plan with the patient. The patient was provided an opportunity to ask questions and all were answered. The patient agreed with the plan and demonstrated an understanding of the instructions.   The patient was advised to call back or seek an in-person evaluation if the symptoms worsen or if the condition fails to improve as anticipated.  I provided 20 minutes of non-face-to-face time during this encounter.  Patient compliant with insomnia medication. Generally takes most nights. No obvious morning drowsiness. Definitely helps patient sleep. Without it patient states would not get a good nights rest.  Blood pressure medicine and blood pressure levels reviewed today with patient. Compliant with blood pressure medicine. States does not miss a dose. No obvious side effects. Blood pressure generally good when checked elsewhere. Watching salt intake.          Review of Systems No headache no chest pain no shortness of breath    Objective:   Physical  Exam  Virtual      Assessment & Plan:  Impression 1 insomnia with element of anxiety.  Controlled well with Xanax.  Medications refilled  2.  Hypertension.  Para good control discussed maintain same meds diet exercise discussed  6 months worth of meds follow-up 10

## 2019-11-07 NOTE — Telephone Encounter (Signed)
[name], padovano are scheduled for a virtual visit with your provider today.    Just as we do with appointments in the office, we must obtain your consent to participate.  Your consent will be active for this visit and any virtual visit you may have with one of our providers in the next 365 days.    If you have a MyChart account, I can also send a copy of this consent to you electronically.  All virtual visits are billed to your insurance company just like a traditional visit in the office.  As this is a virtual visit, video technology does not allow for your provider to perform a traditional examination.  This may limit your provider's ability to fully assess your condition.  If your provider identifies any concerns that need to be evaluated in person or the need to arrange testing such as labs, EKG, etc, we will make arrangements to do so.    Although advances in technology are sophisticated, we cannot ensure that it will always work on either your end or our end.  If the connection with a video visit is poor, we may have to switch to a telephone visit.  With either a video or telephone visit, we are not always able to ensure that we have a secure connection.   I need to obtain your verbal consent now.   Are you willing to proceed with your visit today?   Yvonne [name] has provided verbal consent on 11/07/2019 for a virtual visit (video or telephone).   Kyra Manges, LPN 6/75/9163  8:46 PM

## 2019-11-30 ENCOUNTER — Other Ambulatory Visit: Payer: Self-pay

## 2019-11-30 ENCOUNTER — Encounter: Payer: Self-pay | Admitting: Family Medicine

## 2019-11-30 ENCOUNTER — Telehealth: Payer: Self-pay | Admitting: *Deleted

## 2019-11-30 ENCOUNTER — Telehealth (INDEPENDENT_AMBULATORY_CARE_PROVIDER_SITE_OTHER): Payer: 59 | Admitting: Family Medicine

## 2019-11-30 VITALS — Temp 97.5°F | Ht 64.0 in | Wt 145.0 lb

## 2019-11-30 DIAGNOSIS — J40 Bronchitis, not specified as acute or chronic: Secondary | ICD-10-CM

## 2019-11-30 MED ORDER — AZITHROMYCIN 250 MG PO TABS: ORAL_TABLET | ORAL | 0 refills | Status: DC

## 2019-11-30 NOTE — Telephone Encounter (Signed)
[name], mealor are scheduled for a virtual visit with your provider today.    Just as we do with appointments in the office, we must obtain your consent to participate.  Your consent will be active for this visit and any virtual visit you may have with one of our providers in the next 365 days.    If you have a MyChart account, I can also send a copy of this consent to you electronically.  All virtual visits are billed to your insurance company just like a traditional visit in the office.  As this is a virtual visit, video technology does not allow for your provider to perform a traditional examination.  This may limit your provider's ability to fully assess your condition.  If your provider identifies any concerns that need to be evaluated in person or the need to arrange testing such as labs, EKG, etc, we will make arrangements to do so.    Although advances in technology are sophisticated, we cannot ensure that it will always work on either your end or our end.  If the connection with a video visit is poor, we may have to switch to a telephone visit.  With either a video or telephone visit, we are not always able to ensure that we have a secure connection.   I need to obtain your verbal consent now.   Are you willing to proceed with your visit today?   [name] has provided verbal consent on 11/30/2019 for a virtual visit (video or telephone).   Kyra Manges, LPN 2/70/7867  5:44 AM

## 2019-11-30 NOTE — Progress Notes (Signed)
Patient ID: Yvonne [name], female    DOB: July 16, 1955, 64 y.o.   MRN: [id number]   Virtual Visit via Telephone Note  I connected with Yvonne [name] on 11/30/19 at  9:30 AM EDT by telephone and verified that I am speaking with the correct person using two identifiers.  Location: Patient: home Provider: office   I discussed the limitations, risks, security and privacy concerns of performing an evaluation and management service by telephone and the availability of in person appointments. I also discussed with the patient that there may be a patient responsible charge related to this service. The patient expressed understanding and agreed to proceed.   Chief Complaint  Patient presents with  . Cough   Subjective:    HPIcough and weakness for 2 months. Spitting up yellow mucus. Has not tried any treatments.  Feeling need to cough.  "Feeling it coming from her lungs." Persistent.  Feeling weak and low energy. Had both covid vaccines. No sick contacts. No fever, sinus pain/ear pain, or sore throat.  Would like to know if she can get a RX for nexium. She is buying otc now.  Using natural remedy for flu/cough, tried elderberry.  Medical History Yvonne [name] has a past medical history of Anxiety, Headache(784.0) (02/24/2013), Hypertension, Pneumothorax, and Tachycardia.   Outpatient Encounter Medications as of 11/30/2019  Medication Sig  . ALPRAZolam (XANAX) 1 MG tablet TAKE 1 TABLET BY MOUTH EVERY DAILY AS NEEDED  . aspirin EC 81 MG tablet Take 81 mg by mouth daily.  Marland Kitchen esomeprazole (NEXIUM) 40 MG capsule Take 1 capsule (40 mg total) by mouth daily.  . Garlic 099 MG CAPS Take 500 mg by mouth daily.   . metoprolol tartrate (LOPRESSOR) 50 MG tablet Take 1 tablet (50 mg total) by mouth 2 (two) times daily.  . Multiple Vitamin (MULTIVITAMIN) tablet Take 1 tablet by mouth daily.  . [DISCONTINUED] Cholecalciferol (VITAMIN D-3) 1000 UNITS CAPS Take 1,000 Units by mouth daily.  Marland Kitchen azithromycin  (ZITHROMAX Z-PAK) 250 MG tablet Take 2 tab p.o. day 1, then take 1 tab daily for 4 more days.   No facility-administered encounter medications on file as of 11/30/2019.     Review of Systems  Constitutional: Positive for fatigue. Negative for chills and fever.  HENT: Positive for congestion. Negative for ear pain, rhinorrhea, sinus pressure, sinus pain, sneezing and sore throat.   Eyes: Negative for pain, discharge and itching.  Respiratory: Positive for cough. Negative for chest tightness, shortness of breath and wheezing.   Gastrointestinal: Negative for constipation, diarrhea, nausea and vomiting.     Vitals Temp (!) 97.5 F (36.4 C) Comment: per pt  Ht 5\' 4"  (1.626 m)   Wt 145 lb (65.8 kg) Comment: per pt  BMI 24.89 kg/m   Objective:   Physical Exam  No pe due to phone visit.  Assessment and Plan   1. Bronchitis   Since pt stating it has been persisting for 2 months, will give azithromycin and see if can improve.  Also try cough syrup otc with increase fluids to thin secretions.  F/u if not improving.   Follow Up Instructions:    I discussed the assessment and treatment plan with the patient. The patient was provided an opportunity to ask questions and all were answered. The patient agreed with the plan and demonstrated an understanding of the instructions.   The patient was advised to call back or seek an in-person evaluation if the symptoms worsen or if the condition  fails to improve as anticipated.  I provided 12 minutes of non-face-to-face time during this encounter.

## 2019-12-21 ENCOUNTER — Telehealth: Payer: Self-pay | Admitting: Family Medicine

## 2019-12-21 NOTE — Telephone Encounter (Signed)
Pt is wanting genital herpes med refilled  flare up is  painful Yvonne [name] sent meds in last time. Pt wants med called into [facility] on [address]   Pt wants a call back

## 2019-12-22 ENCOUNTER — Other Ambulatory Visit: Payer: Self-pay | Admitting: Nurse Practitioner

## 2019-12-22 MED ORDER — VALACYCLOVIR HCL 500 MG PO TABS: 500.0000 mg | ORAL_TABLET | Freq: Two times a day (BID) | ORAL | 2 refills | Status: DC

## 2019-12-22 NOTE — Telephone Encounter (Signed)
Medication sent in. Let us know if symptoms persist. Also sent 2 refills

## 2019-12-22 NOTE — Telephone Encounter (Signed)
Discussed with pt

## 2020-01-02 ENCOUNTER — Telehealth: Payer: Self-pay | Admitting: Family Medicine

## 2020-01-02 NOTE — Telephone Encounter (Signed)
Needs appt to be seen here or urgent care.   thx

## 2020-01-02 NOTE — Telephone Encounter (Signed)
Pt had a virtual on 6/10. She took the whole zpack and its still having same symptoms. She is still coughing and constantly coughing up phlegm (dark yellow) and still weak.   [facility] [employer]

## 2020-01-02 NOTE — Telephone Encounter (Signed)
Please advise. No available appts for tomorrow. Please advise. Thank you

## 2020-01-03 NOTE — Telephone Encounter (Signed)
Pt contacted. Pt states she will need a morning appt because she leaves her home at 2:30 pm to go to work (pt works second shift). Pt transferred up front to schedule appt

## 2020-01-04 ENCOUNTER — Ambulatory Visit: Payer: 59 | Admitting: Family Medicine

## 2020-01-12 ENCOUNTER — Telehealth: Payer: Self-pay | Admitting: Family Medicine

## 2020-01-12 NOTE — Telephone Encounter (Signed)
Pt has parasites in her body and they are showing up in her mouth she can see them in her mouth. she is weak and lost 30 pounds 4 to 6 month period. Always feel like she has to cough constantly. She needs Albenza or Mendozobul she has had this in the past and Dr Brett Canales has called it in the past.   Pt Call back [phone]

## 2020-01-12 NOTE — Telephone Encounter (Signed)
Pt states she has had this problem for about 6 months and states its real hard to get rid of these parasites. States last 2 months she has noticed more of the parasites. Advised pt she should be seen and transferred to the front to schedule visit.

## 2020-01-16 ENCOUNTER — Encounter: Payer: Self-pay | Admitting: Family Medicine

## 2020-01-16 ENCOUNTER — Ambulatory Visit (INDEPENDENT_AMBULATORY_CARE_PROVIDER_SITE_OTHER): Payer: 59 | Admitting: Family Medicine

## 2020-01-16 ENCOUNTER — Other Ambulatory Visit: Payer: Self-pay

## 2020-01-16 VITALS — BP 130/78 | HR 71 | Temp 97.7°F | Ht 64.0 in | Wt 138.2 lb

## 2020-01-16 DIAGNOSIS — I10 Essential (primary) hypertension: Secondary | ICD-10-CM

## 2020-01-16 DIAGNOSIS — F419 Anxiety disorder, unspecified: Secondary | ICD-10-CM

## 2020-01-16 DIAGNOSIS — F22 Delusional disorders: Secondary | ICD-10-CM

## 2020-01-16 DIAGNOSIS — Z13220 Encounter for screening for lipoid disorders: Secondary | ICD-10-CM

## 2020-01-16 DIAGNOSIS — Z118 Encounter for screening for other infectious and parasitic diseases: Secondary | ICD-10-CM

## 2020-01-16 MED ORDER — ESOMEPRAZOLE MAGNESIUM 40 MG PO CPDR: 40.0000 mg | DELAYED_RELEASE_CAPSULE | Freq: Every day | ORAL | 5 refills | Status: DC

## 2020-01-16 NOTE — Progress Notes (Signed)
Patient ID: Yvonne [name], female    DOB: [birth year], 64 y.o.   MRN: [id number]   Chief Complaint  Patient presents with   Fatigue   Subjective:    HPI   Pt is having weakness, slight cough with feeling of needing to cough constantly, some golden color phelgm. Pt states she is also having "parasites". Has had them off and on for year. They are now in mouth. Hard to get rid off. Pt has researched parasites on Internet. Thinking she needs a medication to get rid of them. Pt would like a script for Nexium. Pt is using OTC Nexium and states it is expensive. Pt wanting script for nexium for acid reflux.   Was seen in May for phone visit for bronchitis, by me.   Weak at times and coughing. Feeling a tickling feeling in throat to cough.  Feeling phlegm and then spits it up gold/yellow color. Gave abx and still feeling it. Long history in chart of pt having this concern for years about parasites that are in her mouth and won't resolve.  Pt also stating she picks out "thin, white, tread-like worms from her mouth/gums"  She states they are going from her esophagus and is convinced she has worms and needing the treatment to get rid of them.  Pt thinking she got "parasites" Pt stating the worms are in her mouth. Pt stating boyfriend and mother has seen them.  Unable to produce a picture or sample of the "thin white tready worms." Pt stating has left stool sample and slides for the lab "after hours" and no one picks them up and does the analysis.  She also stated she dropped them off on slides for NP to review and never heard a call back.   Per notes multiple providers stating that she has a fixation on parasites and that she has been worked up multiple times for this an no worms found and even yrs ago pt stating was treated with "mebendazole" and wanting this again.  Let pt know it's hard to find and very expensive. No out of country travel or drinking water from natural  sources.  Medical History Yvonne [name] has a past medical history of Anxiety, Headache(784.0) (02/24/2013), Hypertension, Pneumothorax, and Tachycardia.   Outpatient Encounter Medications as of 01/16/2020  Medication Sig   ALPRAZolam (XANAX) 1 MG tablet TAKE 1 TABLET BY MOUTH EVERY DAILY AS NEEDED   aspirin EC 81 MG tablet Take 81 mg by mouth daily.   esomeprazole (NEXIUM) 40 MG capsule Take 1 capsule (40 mg total) by mouth daily.   metoprolol tartrate (LOPRESSOR) 50 MG tablet Take 1 tablet (50 mg total) by mouth 2 (two) times daily.   Multiple Vitamin (MULTIVITAMIN) tablet Take 1 tablet by mouth daily.   [DISCONTINUED] esomeprazole (NEXIUM) 40 MG capsule Take 1 capsule (40 mg total) by mouth daily.   [DISCONTINUED] azithromycin (ZITHROMAX Z-PAK) 250 MG tablet Take 2 tab p.o. day 1, then take 1 tab daily for 4 more days.   [DISCONTINUED] Garlic 242 MG CAPS Take 500 mg by mouth daily.    [DISCONTINUED] valACYclovir (VALTREX) 500 MG tablet Take 1 tablet (500 mg total) by mouth 2 (two) times daily. X 3 d   No facility-administered encounter medications on file as of 01/16/2020.     Review of Systems  Constitutional: Negative for chills and fever.  HENT: Negative for congestion, rhinorrhea and sore throat.   Respiratory: Positive for cough. Negative for shortness of breath and wheezing.  Cardiovascular: Negative for chest pain and leg swelling.  Gastrointestinal: Negative for abdominal pain, diarrhea, nausea and vomiting.  Genitourinary: Negative for dysuria and frequency.  Musculoskeletal: Negative for arthralgias and back pain.  Skin: Negative for rash.  Neurological: Negative for dizziness, weakness and headaches.     Vitals BP (!) 130/78    Pulse 71    Temp 97.7 F (36.5 C)    Ht _0  (1.626 m)    Wt 138 lb 3.2 oz (62.7 kg)    SpO2 97%    BMI 23.72 kg/m   Objective:   Physical Exam Vitals and nursing note reviewed.  Constitutional:      General: She is not in acute  distress.    Appearance: Normal appearance. She is not ill-appearing.  HENT:     Nose: Nose normal. No congestion or rhinorrhea.     Mouth/Throat:     Mouth: Mucous membranes are moist.     Pharynx: No oropharyngeal exudate or posterior oropharyngeal erythema.     Comments: No worms or foreign body seen in mouth/gums. Cardiovascular:     Rate and Rhythm: Regular rhythm.     Pulses: Normal pulses.     Heart sounds: Normal heart sounds.  Pulmonary:     Effort: Pulmonary effort is normal. No respiratory distress.  Musculoskeletal:        General: Normal range of motion.  Skin:    General: Skin is warm and dry.     Findings: No rash.  Neurological:     General: No focal deficit present.     Mental Status: She is alert and oriented to person, place, and time.     Cranial Nerves: No cranial nerve deficit.     Gait: Gait normal.  Psychiatric:     Comments: +anxious affect. Decreased insight and judgment.      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 Anxiety - TSH  3. Hypertension, unspecified type - CBC - CMP14+EGFR  4. Screening examination for parasitic infection - Ova and parasite examination  5. Lipid screening - Lipid panel    Pt very upset when mentioning she has some underlying anxiety and if she ever saw psychiatry or any other specialist for her anxiety and this obsession over parasites.  Pt stating only taking the xanax prn. 1/2-1 tab at night. Pt wanting tablet of mebendazole or albendazole and advised pt needing to leave stool sample for ova/parasites before treating.  That the medication is very expensive and would need to confirm the dx first.  Pt stating parasites are "hit or miss" and no one in office has been able to find or see the parasites. Pt requesting refill on nexium. Advising to increase fluids and use otc cough syrup prn.  Pt to f/u prn.

## 2020-01-26 ENCOUNTER — Encounter: Payer: Self-pay | Admitting: Family Medicine

## 2020-04-24 ENCOUNTER — Telehealth: Payer: Self-pay

## 2020-04-24 ENCOUNTER — Other Ambulatory Visit: Payer: Self-pay | Admitting: *Deleted

## 2020-04-24 NOTE — Telephone Encounter (Signed)
Pt last seen 01/16/20. Please advise. Thank you

## 2020-04-24 NOTE — Telephone Encounter (Signed)
Pt needing refill on metoprolol tartrate (LOPRESSOR) 50 MG tablet [facility] [phone] - [place],  - [address] AT [facility] [address] Patient is down to two pills   PT Call back [phone]

## 2020-04-25 MED ORDER — METOPROLOL TARTRATE 50 MG PO TABS: 50.0000 mg | ORAL_TABLET | Freq: Two times a day (BID) | ORAL | 5 refills | Status: DC

## 2020-04-25 NOTE — Telephone Encounter (Signed)
Yes pls give 90 day worth, 1  refills thx. Dr. Ladona Ridgel

## 2020-04-25 NOTE — Telephone Encounter (Signed)
Medication sent to pharmacy. Pt prefers monthly refill. Pt verbalized understanding

## 2020-05-13 NOTE — Telephone Encounter (Signed)
Called twice to schedule appointment

## 2020-05-14 MED ORDER — METOPROLOL TARTRATE 50 MG PO TABS: 50.0000 mg | ORAL_TABLET | Freq: Two times a day (BID) | ORAL | 0 refills | Status: DC

## 2020-05-14 NOTE — Telephone Encounter (Signed)
Made appointment for 12/6 for medication follow up

## 2020-05-21 ENCOUNTER — Telehealth: Payer: Self-pay | Admitting: Family Medicine

## 2020-05-21 NOTE — Telephone Encounter (Signed)
Pharmacy requesting refill on Alprazolam 1 mg tablet. Take one tablet po q day prn. Please advise. Thank you

## 2020-05-22 MED ORDER — ALPRAZOLAM 1 MG PO TABS: ORAL_TABLET | ORAL | 2 refills | Status: DC

## 2020-05-22 NOTE — Telephone Encounter (Signed)
Please contact patient to have her set up appt. Thank you 

## 2020-05-22 NOTE — Addendum Note (Signed)
Addended by: Annalee Genta on: 05/22/2020 08:44 AM   Modules accepted: Orders

## 2020-05-24 NOTE — Telephone Encounter (Signed)
Patient has appointment 12/9

## 2020-05-27 ENCOUNTER — Ambulatory Visit: Payer: 59 | Admitting: Family Medicine

## 2020-05-30 ENCOUNTER — Ambulatory Visit: Payer: 59 | Admitting: Family Medicine

## 2020-05-30 ENCOUNTER — Telehealth: Payer: Self-pay | Admitting: Family Medicine

## 2020-05-30 NOTE — Telephone Encounter (Signed)
Patient had appointment 12/9 and no showed for medication follow up

## 2020-05-30 NOTE — Telephone Encounter (Signed)
Please call pt and schedule a follow up visit for her controlled meds

## 2020-05-30 NOTE — Telephone Encounter (Signed)
Called pharm and canceled refills

## 2020-07-12 ENCOUNTER — Other Ambulatory Visit: Payer: Self-pay

## 2020-07-12 ENCOUNTER — Ambulatory Visit: Payer: 59 | Admitting: Family Medicine

## 2020-07-12 ENCOUNTER — Ambulatory Visit (INDEPENDENT_AMBULATORY_CARE_PROVIDER_SITE_OTHER): Payer: 59 | Admitting: Nurse Practitioner

## 2020-07-12 ENCOUNTER — Encounter: Payer: Self-pay | Admitting: Nurse Practitioner

## 2020-07-12 VITALS — BP 138/86 | HR 96 | Temp 97.4°F | Wt 144.6 lb

## 2020-07-12 DIAGNOSIS — K219 Gastro-esophageal reflux disease without esophagitis: Secondary | ICD-10-CM | POA: Diagnosis not present

## 2020-07-12 DIAGNOSIS — M545 Low back pain, unspecified: Secondary | ICD-10-CM

## 2020-07-12 DIAGNOSIS — J069 Acute upper respiratory infection, unspecified: Secondary | ICD-10-CM

## 2020-07-12 DIAGNOSIS — I10 Essential (primary) hypertension: Secondary | ICD-10-CM

## 2020-07-12 DIAGNOSIS — B9689 Other specified bacterial agents as the cause of diseases classified elsewhere: Secondary | ICD-10-CM

## 2020-07-12 DIAGNOSIS — F419 Anxiety disorder, unspecified: Secondary | ICD-10-CM

## 2020-07-12 LAB — POCT URINALYSIS DIPSTICK (MANUAL)
Spec Grav, UA: >=1.03 — AB (ref 1.010–1.025)
pH, UA: 5 (ref 5.0–8.0)

## 2020-07-12 MED ORDER — ALPRAZOLAM 1 MG PO TABS: ORAL_TABLET | ORAL | 2 refills | Status: DC

## 2020-07-12 MED ORDER — METOPROLOL TARTRATE 50 MG PO TABS: 50.0000 mg | ORAL_TABLET | Freq: Two times a day (BID) | ORAL | 2 refills | Status: DC

## 2020-07-12 MED ORDER — ESOMEPRAZOLE MAGNESIUM 40 MG PO CPDR: 40.0000 mg | DELAYED_RELEASE_CAPSULE | Freq: Every day | ORAL | 2 refills | Status: DC

## 2020-07-12 MED ORDER — AZITHROMYCIN 250 MG PO TABS
ORAL_TABLET | ORAL | 0 refills | Status: DC
Start: 2020-07-12 — End: 2020-10-22

## 2020-07-12 NOTE — Progress Notes (Addendum)
Subjective:    Patient ID: Yvonne [name], female    DOB: [birth year], 65 y.o.   MRN: [id number]  HPI  Patient presents for med check for refill of her medications, nexium, metoprolol, and xanax. BP today is 138/86, consistent with BP last July.  HR is 96, RRR. Reports anxiety and sleep are well-controlled by xanax taken daily at bedtime.  Nexium daily is controlling reflux.    Patient c/o cold symptoms present for approx 1 month.  States meeting with a sick friend a couple of days prior to Christmas.  She has been sneezing and coughing since, no fever but having chills.  Denies sore throat or headache. Reports productive cough, small amount of yellow, thick mucus, that is worst in the morning, but clear runny nasal discharge. She coughs intentionally to clear phlegm out of her throat (per patient).  Has tried to take generic brand of Nyquil, but the congestion seems to linger.  Of note, patient consulted Dr. Ladona Ridgel for the same issue twice last year, and Dr. Gerda Diss with the same complaint prior to that.   Also c/o back pain present for approx 1 month.  No history of injury. It is located in the middle, lower right side of her back.  She is concerned she has a kidney infection and has been voiding once or twice in the night, large amounts of urine. No incontinence.  She states her appetite has been normal. The pain comes and goes intermittently, and is a sharp, stabbing pain, but not severe. Localized to the back area.  It is not related to any activity, and she feels it whether she is laying down or walking. Has not tried anything for the pain.  Denies burning or pain with urination. Bowel movements are normal, no change. Has seen a chiropractor in the past who told her she has mild scoliosis. Reports diagnosis of degenerative disc disease after an MRI approx 2 years ago of her neck (per pt).  Denies numbness, weakness or tingling in extremities.    Review of Systems  Constitutional: Positive for  chills. Negative for fatigue and fever.  HENT: Positive for congestion, postnasal drip, rhinorrhea and sneezing. Negative for ear pain, sinus pressure, sinus pain and sore throat.   Respiratory: Positive for cough. Negative for shortness of breath and wheezing.   Cardiovascular: Negative for chest pain and palpitations.  Gastrointestinal: Negative for abdominal pain and constipation.  Endocrine: Negative for polyuria.  Genitourinary: Negative for difficulty urinating, dysuria, enuresis, flank pain, frequency, hematuria and pelvic pain.  Musculoskeletal: Positive for back pain. Negative for neck pain.  Neurological: Negative for dizziness, weakness, light-headedness, numbness and headaches.  Psychiatric/Behavioral: Negative for sleep disturbance. The patient is not nervous/anxious.    PHQ9 SCORE ONLY 07/12/2020  PHQ-9 Total Score 1   GAD 7 : Generalized Anxiety Score 07/12/2020 11/07/2019  Nervous, Anxious, on Edge 0 1  Control/stop worrying 0 0  Worry too much - different things 0 0  Trouble relaxing 0 0  Restless 0 0  Easily annoyed or irritable 0 1  Afraid - awful might happen 0 0  Total GAD 7 Score 0 2  Anxiety Difficulty - Not difficult at all         Objective:   Physical Exam Vitals and nursing note reviewed.  Constitutional:      Appearance: Normal appearance. She is normal weight.  HENT:     Right Ear: A middle ear effusion is present. Tympanic membrane is not erythematous.  Left Ear: Tympanic membrane normal.  No middle ear effusion. Tympanic membrane is not erythematous.     Nose: Rhinorrhea present. Rhinorrhea is clear.     Right Sinus: No maxillary sinus tenderness or frontal sinus tenderness.     Left Sinus: No maxillary sinus tenderness or frontal sinus tenderness.     Mouth/Throat:     Mouth: Mucous membranes are moist.     Pharynx: No pharyngeal swelling, oropharyngeal exudate or posterior oropharyngeal erythema.  Eyes:     Conjunctiva/sclera: Conjunctivae  normal.  Cardiovascular:     Rate and Rhythm: Normal rate and regular rhythm.     Heart sounds: Normal heart sounds.  Pulmonary:     Effort: Pulmonary effort is normal. No respiratory distress.     Breath sounds: Normal breath sounds. No stridor. No wheezing, rhonchi or rales.  Chest:     Chest wall: No tenderness.  Abdominal:     General: There is no distension.     Palpations: Abdomen is soft.     Tenderness: There is no abdominal tenderness. There is no right CVA tenderness or left CVA tenderness.  Musculoskeletal:        General: Deformity present. No swelling or tenderness. Normal range of motion.     Comments: Mild scoliosis noted on exam. SLR negative bilaterally. Localized tenderness to palpation just lateral to the upper lumbar spine left side.   Lymphadenopathy:     Cervical: Cervical adenopathy present.     Right cervical: No posterior cervical adenopathy.    Left cervical: Posterior cervical adenopathy present.  Skin:    General: Skin is warm and dry.  Neurological:     Mental Status: She is alert and oriented to person, place, and time.     Sensory: No sensory deficit.     Motor: No weakness.     Coordination: Coordination normal.     Gait: Gait normal.     Deep Tendon Reflexes: Reflexes normal.     Reflex Scores:      Patellar reflexes are 3+ on the right side and 3+ on the left side.      Achilles reflexes are 3+ on the right side and 3+ on the left side. Psychiatric:        Mood and Affect: Mood normal.        Behavior: Behavior normal.    Results for orders placed or performed in visit on 07/12/20  POCT Urinalysis Dip Manual  Result Value Ref Range   Spec Grav, UA >=1.030 (A) 1.010 - 1.025   pH, UA 5.0 5.0 - 8.0   Leukocytes, UA     Nitrite, UA     Poct Protein     Poct Glucose     Poct Ketones     Poct Urobilinogen     Poct Bilirubin     Poct Blood     Today's Vitals   07/12/20 1403  BP: 138/86  Pulse: 96  Temp: (!) 97.4 F (36.3 C)  SpO2:  98%  Weight: 144 lb 9.6 oz (65.6 kg)   Body mass index is 24.82 kg/m.      Assessment & Plan:   Problem List Items Addressed This Visit      Cardiovascular and Mediastinum   Hypertension   Relevant Medications   metoprolol tartrate (LOPRESSOR) 50 MG tablet     Digestive   Gastroesophageal reflux disease without esophagitis   Relevant Medications   esomeprazole (NEXIUM) 40 MG capsule     Other  Anxiety   Relevant Medications   ALPRAZolam (XANAX) 1 MG tablet    Other Visit Diagnoses    Acute right-sided low back pain without sciatica    -  Primary   Relevant Orders   POCT Urinalysis Dip Manual (Completed)   Bacterial URI       Relevant Medications   azithromycin (ZITHROMAX Z-PAK) 250 MG tablet   Other Relevant Orders   Novel Coronavirus, NAA ([facility])      Meds ordered this encounter  Medications  . azithromycin (ZITHROMAX Z-PAK) 250 MG tablet    Sig: Take 2 tablets (500 mg) on  Day 1,  followed by 1 tablet (250 mg) once daily on Days 2 through 5.    Dispense:  6 each    Refill:  0    Order Specific Question:   Supervising Provider    Answer:   Lilyan Punt A [[id number]]  . ALPRAZolam (XANAX) 1 MG tablet    Sig: TAKE 1/2- 1 TABLET p.o. daily prn.    Dispense:  30 tablet    Refill:  2    Order Specific Question:   Supervising Provider    Answer:   Lilyan Punt A [id number]  . esomeprazole (NEXIUM) 40 MG capsule    Sig: Take 1 capsule (40 mg total) by mouth daily.    Dispense:  30 capsule    Refill:  2    Order Specific Question:   Supervising Provider    Answer:   Lilyan Punt A [[id number]]  . metoprolol tartrate (LOPRESSOR) 50 MG tablet    Sig: Take 1 tablet (50 mg total) by mouth 2 (two) times daily.    Dispense:  60 tablet    Refill:  2    Order Specific Question:   Supervising Provider    Answer:   Lilyan Punt A [[id number]]    Due to the ongoing COVID19 pandemic, we will test patient for Covid per her request.  Start antibiotics as directed. Continue OTC meds  for symptomatic relief.   She has been given a sample of voltaren gel for the back pain.  Can use hot or cold packs to assist with inflammation and pain as well.  She will do some stretches for her back.  She declines imaging such as xrays of her back at this time and will follow-up on the back pain in one month if no improvement, sooner if symptoms worsen. Recommend wellness exam in the near future.  30 minutes was spent with the patient including previsit chart review, time spent with patient, discussion of health issues, review of data including medical record, and documentation of the visit.

## 2020-07-12 NOTE — Patient Instructions (Addendum)
Patient can apply voltaren gel and cold/hot packs.   Back stretches.

## 2020-07-12 NOTE — Progress Notes (Signed)
   Subjective:    Patient ID: Yvonne [name], female    DOB: 10/18/1955, 65 y.o.   MRN: [id number]  Cough This is a new problem. Episode onset: 1 month. Associated symptoms comments: Patient reports sneezing, nasal drainage, chills that come and go. She is coughing on purpose to move mucous up.   Also mentions right lower back pain that started around the same time and comes and goes. .      Review of Systems  Respiratory: Positive for cough.        Objective:   Physical Exam        Assessment & Plan:

## 2020-07-14 LAB — NOVEL CORONAVIRUS, NAA: SARS-CoV-2, NAA: NOT DETECTED

## 2020-07-14 LAB — SARS-COV-2, NAA 2 DAY TAT

## 2020-07-17 ENCOUNTER — Telehealth: Payer: Self-pay

## 2020-07-17 NOTE — Telephone Encounter (Signed)
Mylinh called and want to know the results of her Covid test   Pt call back [phone]

## 2020-07-17 NOTE — Telephone Encounter (Signed)
Left message to return call 

## 2020-07-18 ENCOUNTER — Other Ambulatory Visit: Payer: Self-pay | Admitting: Family Medicine

## 2020-07-18 NOTE — Telephone Encounter (Signed)
Pt notified test was negative. Pt states she is feeling better.

## 2020-08-15 IMAGING — MG DIGITAL SCREENING BILATERAL MAMMOGRAM WITH TOMO AND CAD
8 series · 9 of 24 positions shown · non-contrast
Comparison: Previous exam(s).

CLINICAL DATA: Screening.

EXAM:
DIGITAL SCREENING BILATERAL MAMMOGRAM WITH TOMO AND CAD

[R CC synth-2D]
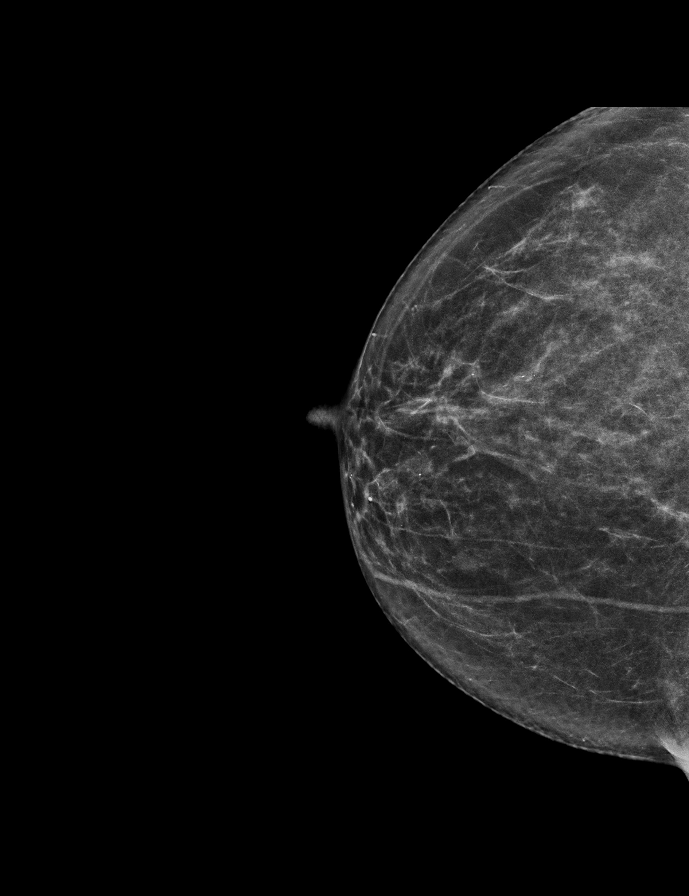

[L MLO synth-2D]
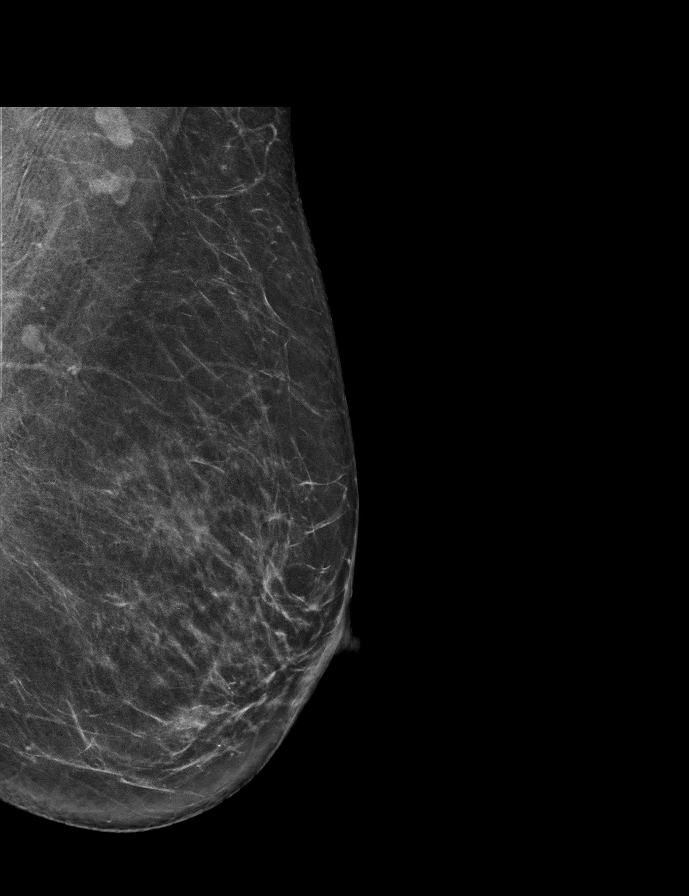

[L CC synth-2D]
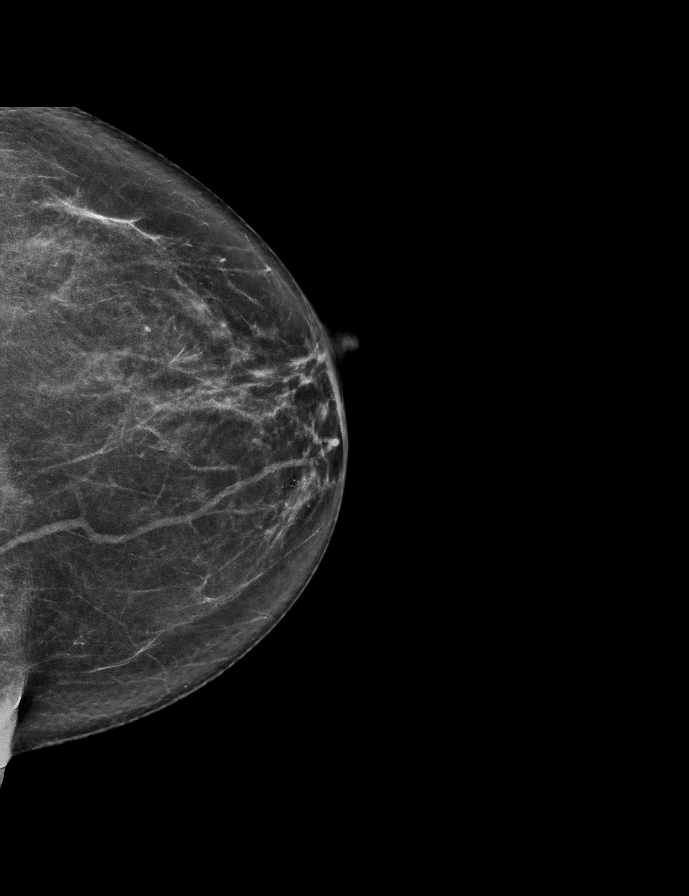

[R MLO synth-2D]
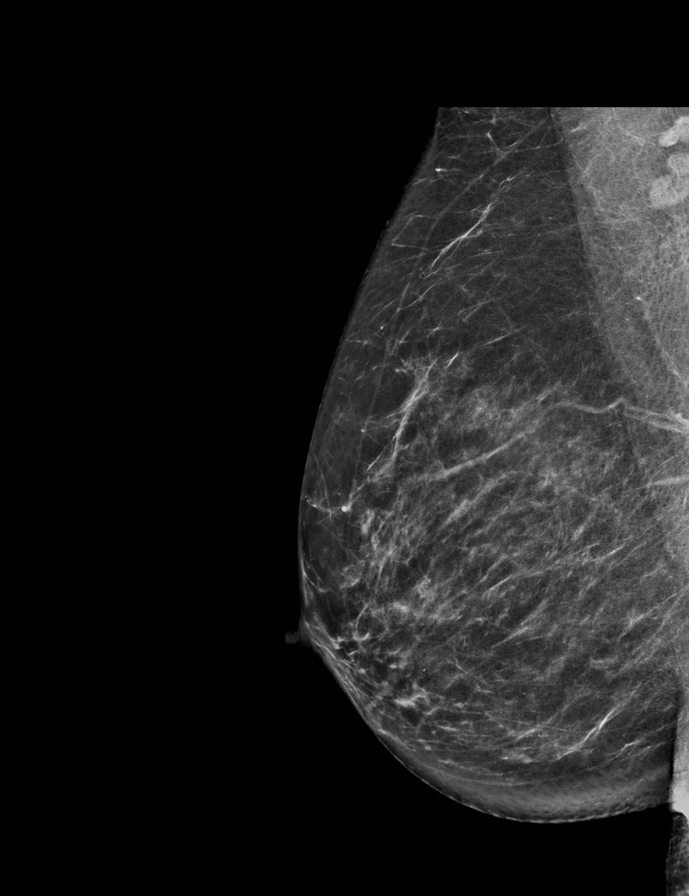

[L CC tomo · 2 of 67 frames shown]
[frame 22/67]
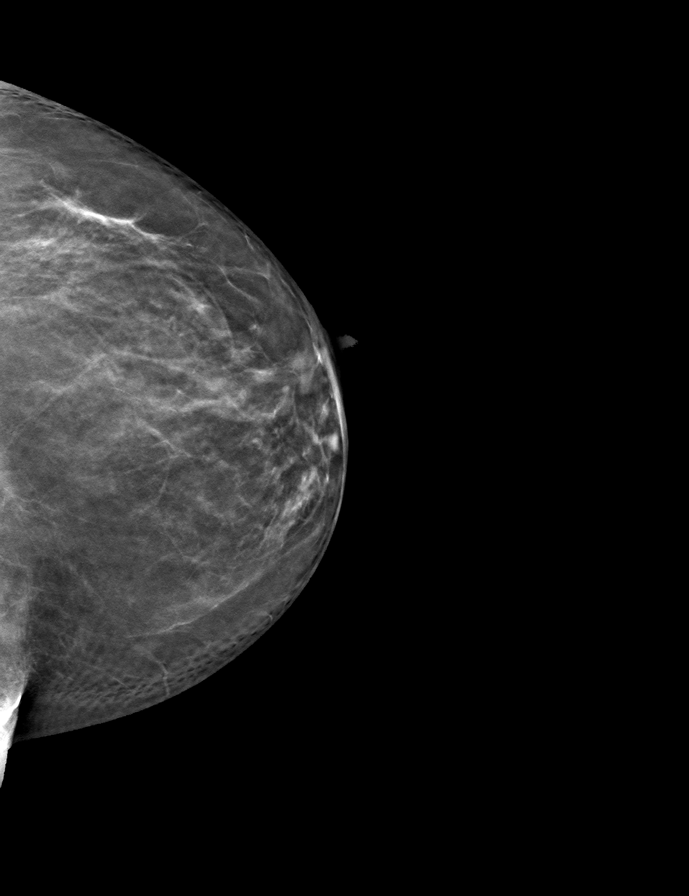
[frame 34/67]
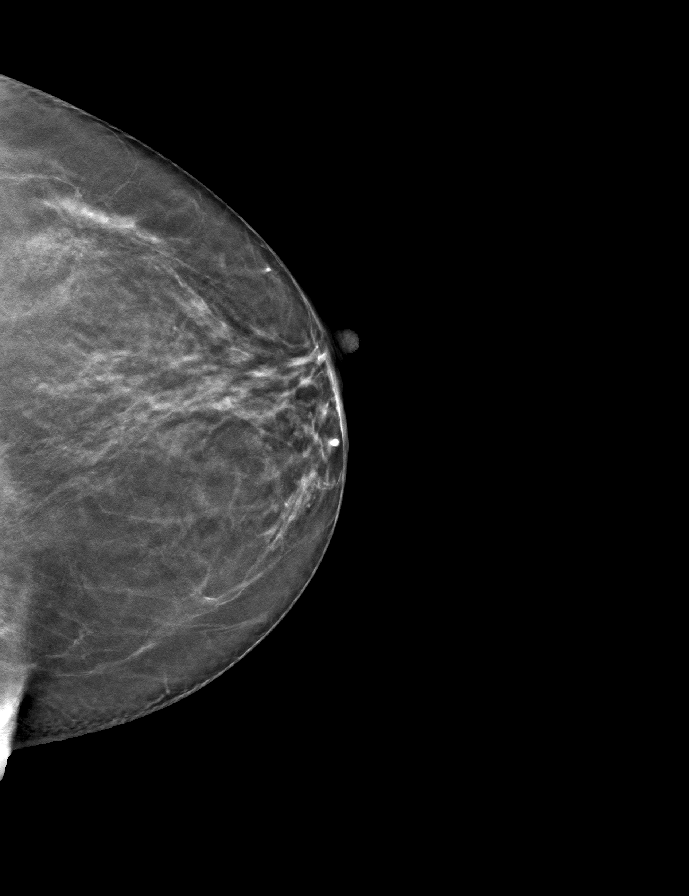

[R CC tomo · tomo slice 33/65.0]
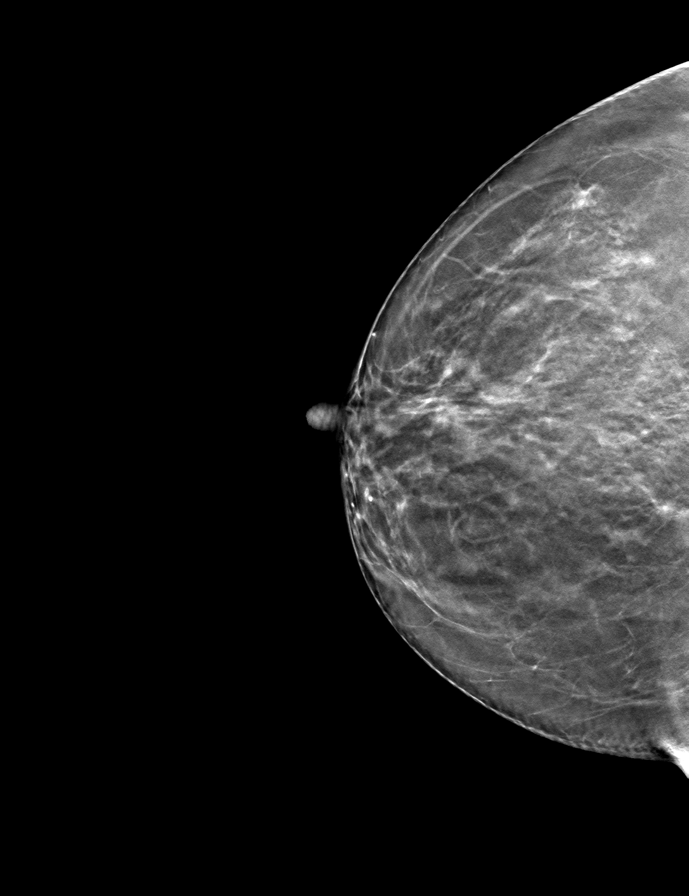

[L MLO tomo · tomo slice 35/69.0]
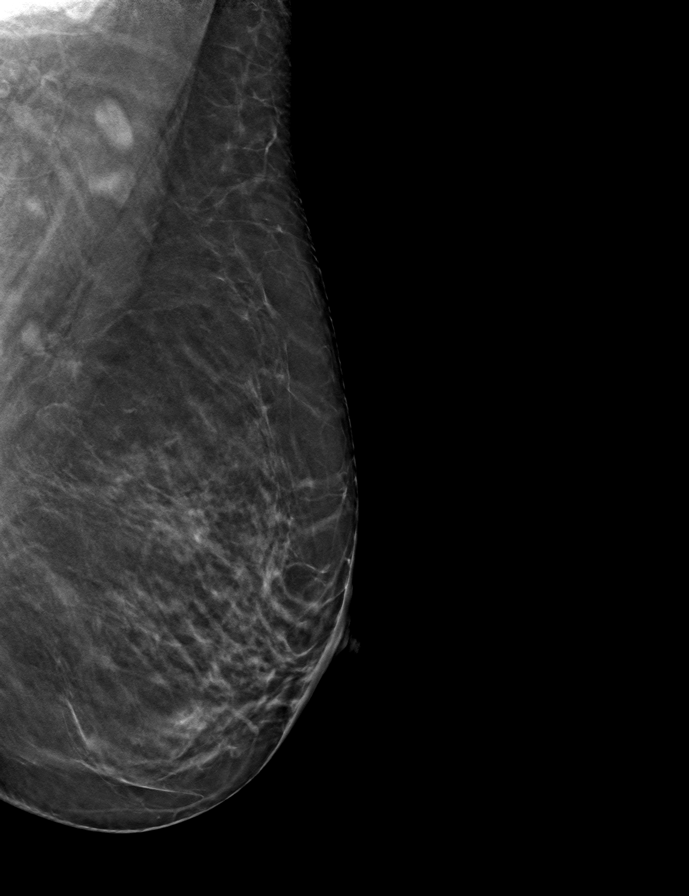

[R MLO tomo · tomo slice 35/68.0]
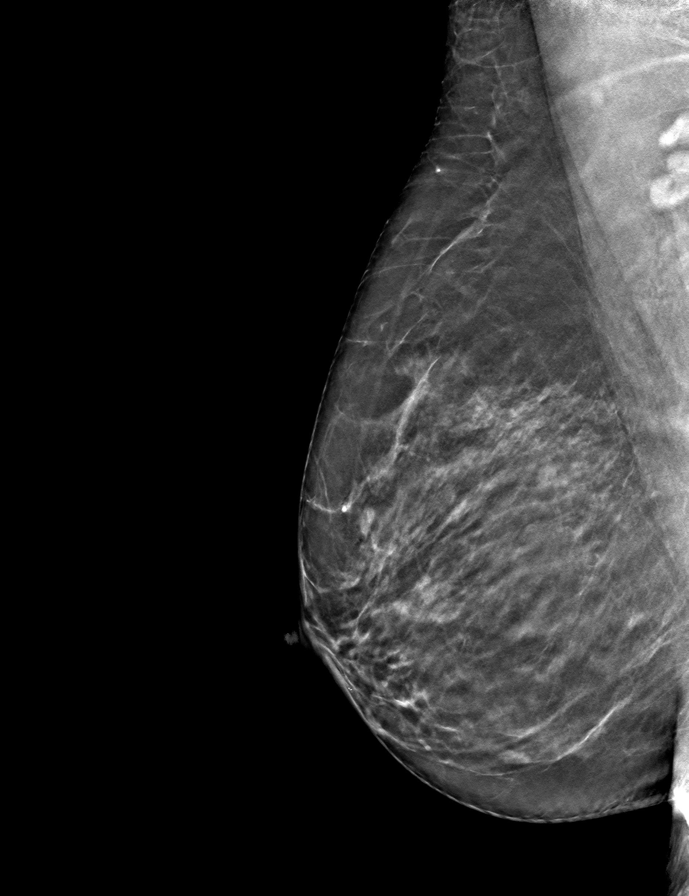

[9 of 24 positions shown; findings below may reference images not displayed]

ACR Breast Density Category c: The breast tissue is heterogeneously
dense, which may obscure small masses.
FINDINGS: In the left breast, calcifications warrant further evaluation. In
the right breast, no findings suspicious for malignancy. Images were
processed with CAD.
IMPRESSION: Further evaluation is suggested for calcifications in the left
breast.

RECOMMENDATION:
Diagnostic mammogram of the left breast. (Code:[id number])

The patient will be contacted regarding the findings, and additional
imaging will be scheduled.

BI-RADS CATEGORY  0: Incomplete. Need additional imaging evaluation
and/or prior mammograms for comparison.

## 2020-10-19 ENCOUNTER — Other Ambulatory Visit: Payer: Self-pay | Admitting: Nurse Practitioner

## 2020-10-22 ENCOUNTER — Other Ambulatory Visit: Payer: Self-pay

## 2020-10-22 ENCOUNTER — Ambulatory Visit (INDEPENDENT_AMBULATORY_CARE_PROVIDER_SITE_OTHER): Payer: 59 | Admitting: Family Medicine

## 2020-10-22 VITALS — BP 119/78 | HR 75 | Temp 97.3°F | Ht 64.0 in | Wt 146.0 lb

## 2020-10-22 DIAGNOSIS — K219 Gastro-esophageal reflux disease without esophagitis: Secondary | ICD-10-CM

## 2020-10-22 DIAGNOSIS — F419 Anxiety disorder, unspecified: Secondary | ICD-10-CM

## 2020-10-22 DIAGNOSIS — R002 Palpitations: Secondary | ICD-10-CM | POA: Diagnosis not present

## 2020-10-22 DIAGNOSIS — Z13220 Encounter for screening for lipoid disorders: Secondary | ICD-10-CM

## 2020-10-22 DIAGNOSIS — I10 Essential (primary) hypertension: Secondary | ICD-10-CM | POA: Diagnosis not present

## 2020-10-22 DIAGNOSIS — B829 Intestinal parasitism, unspecified: Secondary | ICD-10-CM

## 2020-10-22 MED ORDER — METOPROLOL TARTRATE 50 MG PO TABS: 50.0000 mg | ORAL_TABLET | Freq: Two times a day (BID) | ORAL | 2 refills | Status: DC

## 2020-10-22 MED ORDER — ALPRAZOLAM 1 MG PO TABS: ORAL_TABLET | ORAL | 2 refills | Status: DC

## 2020-10-22 MED ORDER — ESOMEPRAZOLE MAGNESIUM 40 MG PO CPDR: DELAYED_RELEASE_CAPSULE | ORAL | 0 refills | Status: DC

## 2020-10-22 NOTE — Progress Notes (Signed)
Patient ID: Yvonne [name], female    DOB: 1955-11-21, 65 y.o.   MRN: [id number]   Chief Complaint  Patient presents with  . Medication Refill   Subjective:  Cc: medication management for GERD, hypertension/ palpitations, anxiety  Presents today for medication management for GERD, anxiety, hypertension and palpitations.  Reports that she is compliant with her medications, takes daily as prescribed.  Denies fever, chills, chest pain, shortness of breath, lower extremity edema, headaches or vision changes.  It is noted that she has not had lab work done in a while, will discuss with her today.    Medical History Yvonne [name] has a past medical history of Anxiety, Headache(784.0) (02/24/2013), Hypertension, Pneumothorax, and Tachycardia.   Outpatient Encounter Medications as of 10/22/2020  Medication Sig  . ALPRAZolam (XANAX) 1 MG tablet TAKE 1/2- 1 TABLET p.o. daily prn.  . aspirin EC 81 MG tablet Take 81 mg by mouth daily.  Marland Kitchen esomeprazole (NEXIUM) 40 MG capsule TAKE 1 CAPSULE(40 MG) BY MOUTH DAILY  . metoprolol tartrate (LOPRESSOR) 50 MG tablet Take 1 tablet (50 mg total) by mouth 2 (two) times daily.  . Multiple Vitamin (MULTIVITAMIN) tablet Take 1 tablet by mouth daily.  . [DISCONTINUED] ALPRAZolam (XANAX) 1 MG tablet TAKE 1/2- 1 TABLET p.o. daily prn.  . [DISCONTINUED] azithromycin (ZITHROMAX Z-PAK) 250 MG tablet Take 2 tablets (500 mg) on  Day 1,  followed by 1 tablet (250 mg) once daily on Days 2 through 5.  . [DISCONTINUED] esomeprazole (NEXIUM) 40 MG capsule Take 1 capsule (40 mg total) by mouth daily.  . [DISCONTINUED] esomeprazole (NEXIUM) 40 MG capsule TAKE 1 CAPSULE(40 MG) BY MOUTH DAILY  . [DISCONTINUED] metoprolol tartrate (LOPRESSOR) 50 MG tablet Take 1 tablet (50 mg total) by mouth 2 (two) times daily.   No facility-administered encounter medications on file as of 10/22/2020.     Review of Systems  Constitutional: Negative for chills and fever.  Respiratory: Negative for  cough, chest tightness and shortness of breath.   Cardiovascular: Positive for palpitations. Negative for chest pain and leg swelling.       Takes beta blocker, worse with lying down.   Neurological: Positive for light-headedness. Negative for dizziness and headaches.       Has cervical neck issues for years and attributes light-headedness from that.  Has not seen specialist for neck issues yet.   Psychiatric/Behavioral: Positive for sleep disturbance. The patient is nervous/anxious.        Takes Xanax daily for sleep.      Vitals BP 119/78   Pulse 75   Temp (!) 97.3 F (36.3 C)   Ht _0  (1.626 m)   Wt 146 lb (66.2 kg)   SpO2 98%   BMI 25.06 kg/m   Objective:   Physical Exam Vitals and nursing note reviewed.  Constitutional:      Appearance: Normal appearance.  Cardiovascular:     Rate and Rhythm: Normal rate and regular rhythm.     Heart sounds: Normal heart sounds.  Pulmonary:     Effort: Pulmonary effort is normal.     Breath sounds: Normal breath sounds.  Skin:    General: Skin is warm and dry.  Neurological:     General: No focal deficit present.     Mental Status: She is alert.  Psychiatric:        Behavior: Behavior normal.      Assessment and Plan   1. Primary hypertension - metoprolol tartrate (LOPRESSOR) 50 MG tablet;  Take 1 tablet (50 mg total) by mouth 2 (two) times daily.  Dispense: 60 tablet; Refill: 2 - Comprehensive Metabolic Panel (CMET)  2. Gastroesophageal reflux disease without esophagitis - esomeprazole (NEXIUM) 40 MG capsule; TAKE 1 CAPSULE(40 MG) BY MOUTH DAILY  Dispense: 30 capsule; Refill: 0  3. Anxiety - ALPRAZolam (XANAX) 1 MG tablet; TAKE 1/2- 1 TABLET p.o. daily prn.  Dispense: 30 tablet; Refill: 2  4. Palpitations - metoprolol tartrate (LOPRESSOR) 50 MG tablet; Take 1 tablet (50 mg total) by mouth 2 (two) times daily.  Dispense: 60 tablet; Refill: 2  5. Screening for cholesterol level - Lipid Profile  6. Parasites in  stool - Ova and parasite examination   Extensive discussion today around practicing safe medicine and needing laboratory work to assess major organ function.  Reports that she is scared of needles, cries when she gets her labs drawn.  Encouraged her to get lab work as soon as possible.    Hypertension Medicatlion compliance: takes daily as proscribed.  Denies chest pain, shortness of breath, lower extremity edema, vision changes, headaches.  Pertinent lab work: due for labs now. Ordered today.  Monitoring: every 3 months, once gets labs, can possibly move to 6 months.  Side effects: none  Continue current medication regimen: continue current regimen   Discussed parasite infection concerns. Encouraged Yvonne [name] to bring stool sample to the lab for evaluation for proper treatment of parasites.  Ova and parasite kit and instructions provided today at this visit.   Agrees with plan of care discussed today. Understands warning signs to seek further care: chest pain, shortness of breath, any significant change in health.  Understands to follow-up in 3 months, once labs are obtained and kidney function is deemed stable, okay to change to every 6 months for medication management.  Will notify once results of stool sample are available, treat parasites as indicated at that time.  Yvonne Ades, NP 10/22/2020

## 2020-10-23 LAB — COMPREHENSIVE METABOLIC PANEL
ALT: 16 IU/L (ref 0–32)
AST: 19 IU/L (ref 0–40)
Albumin/Globulin Ratio: 2 (ref 1.2–2.2)
Albumin: 4.8 g/dL (ref 3.8–4.8)
Alkaline Phosphatase: 102 IU/L (ref 44–121)
BUN/Creatinine Ratio: 22 (ref 12–28)
BUN: 17 mg/dL (ref 8–27)
Bilirubin Total: 0.5 mg/dL (ref 0.0–1.2)
CO2: 27 mmol/L (ref 20–29)
Calcium: 9.9 mg/dL (ref 8.7–10.3)
Chloride: 101 mmol/L (ref 96–106)
Creatinine, Ser: 0.76 mg/dL (ref 0.57–1.00)
Globulin, Total: 2.4 g/dL (ref 1.5–4.5)
Glucose: 70 mg/dL (ref 65–99)
Potassium: 4.6 mmol/L (ref 3.5–5.2)
Sodium: 143 mmol/L (ref 134–144)
Total Protein: 7.2 g/dL (ref 6.0–8.5)
eGFR: 89 mL/min/{1.73_m2} (ref >59)

## 2020-10-23 LAB — LIPID PANEL
Chol/HDL Ratio: 2.9 ratio (ref 0.0–4.4)
Cholesterol, Total: 238 mg/dL — ABNORMAL HIGH (ref 100–199)
HDL: 82 mg/dL (ref >39)
LDL Chol Calc (NIH): 109 mg/dL — ABNORMAL HIGH (ref 0–99)
Triglycerides: 279 mg/dL — ABNORMAL HIGH (ref 0–149)
VLDL Cholesterol Cal: 47 mg/dL — ABNORMAL HIGH (ref 5–40)

## 2020-11-20 ENCOUNTER — Other Ambulatory Visit: Payer: Self-pay | Admitting: Family Medicine

## 2020-11-20 DIAGNOSIS — K219 Gastro-esophageal reflux disease without esophagitis: Secondary | ICD-10-CM

## 2020-12-26 ENCOUNTER — Ambulatory Visit: Payer: 59 | Admitting: Family Medicine

## 2021-02-09 ENCOUNTER — Other Ambulatory Visit: Payer: Self-pay | Admitting: Nurse Practitioner

## 2021-02-09 DIAGNOSIS — F419 Anxiety disorder, unspecified: Secondary | ICD-10-CM

## 2021-02-12 NOTE — Telephone Encounter (Signed)
Please contact patient to have her schedule appt. Thank you! °

## 2021-02-12 NOTE — Telephone Encounter (Signed)
Mail box full couldn't leave message 8/24

## 2021-02-18 ENCOUNTER — Other Ambulatory Visit: Payer: Self-pay | Admitting: Family Medicine

## 2021-02-18 DIAGNOSIS — K219 Gastro-esophageal reflux disease without esophagitis: Secondary | ICD-10-CM

## 2021-04-14 ENCOUNTER — Other Ambulatory Visit: Payer: Self-pay | Admitting: Family Medicine

## 2021-04-14 DIAGNOSIS — I10 Essential (primary) hypertension: Secondary | ICD-10-CM

## 2021-04-14 DIAGNOSIS — R002 Palpitations: Secondary | ICD-10-CM

## 2021-04-17 ENCOUNTER — Other Ambulatory Visit: Payer: Self-pay

## 2021-04-17 ENCOUNTER — Ambulatory Visit (INDEPENDENT_AMBULATORY_CARE_PROVIDER_SITE_OTHER): Payer: 59 | Admitting: Family Medicine

## 2021-04-17 ENCOUNTER — Encounter: Payer: Self-pay | Admitting: Family Medicine

## 2021-04-17 DIAGNOSIS — E785 Hyperlipidemia, unspecified: Secondary | ICD-10-CM | POA: Insufficient documentation

## 2021-04-17 DIAGNOSIS — I10 Essential (primary) hypertension: Secondary | ICD-10-CM

## 2021-04-17 DIAGNOSIS — E7800 Pure hypercholesterolemia, unspecified: Secondary | ICD-10-CM

## 2021-04-17 DIAGNOSIS — F419 Anxiety disorder, unspecified: Secondary | ICD-10-CM | POA: Diagnosis not present

## 2021-04-17 DIAGNOSIS — R0982 Postnasal drip: Secondary | ICD-10-CM

## 2021-04-17 DIAGNOSIS — K219 Gastro-esophageal reflux disease without esophagitis: Secondary | ICD-10-CM

## 2021-04-17 DIAGNOSIS — M545 Low back pain, unspecified: Secondary | ICD-10-CM

## 2021-04-17 DIAGNOSIS — R002 Palpitations: Secondary | ICD-10-CM

## 2021-04-17 MED ORDER — ALPRAZOLAM 1 MG PO TABS: ORAL_TABLET | ORAL | 2 refills | Status: DC

## 2021-04-17 MED ORDER — FLUTICASONE PROPIONATE 50 MCG/ACT NA SUSP: 2.0000 | Freq: Every day | NASAL | 6 refills | Status: DC

## 2021-04-17 MED ORDER — ESOMEPRAZOLE MAGNESIUM 40 MG PO CPDR: DELAYED_RELEASE_CAPSULE | ORAL | 5 refills | Status: DC

## 2021-04-17 MED ORDER — CETIRIZINE HCL 10 MG PO TABS
10.0000 mg | ORAL_TABLET | Freq: Every day | ORAL | 1 refills | Status: DC
Start: 2021-04-17 — End: 2021-09-19

## 2021-04-17 MED ORDER — METOPROLOL TARTRATE 50 MG PO TABS: ORAL_TABLET | ORAL | 5 refills | Status: DC

## 2021-04-17 NOTE — Progress Notes (Signed)
Subjective:  Patient ID: Yvonne [name], female    DOB: 06-Oct-1955  Age: 65 y.o. MRN: [id number]  CC: Chief Complaint  Patient presents with   Hypertension    Follow up- establish care Need refills of xanax, nexium, lopressor Lower back pain for last few days- been doing a lot of house work and other work and doesn't know if pulled something  Constantly coughs up a lot of phlegm-chronically has to clear throat.    HPI:  65 year old female with Anxiety, HTN, HLD presents for follow up. Patient complains of low back pain. Also reports coughing up mucus.  Hypertension Stable.  Needs refill on Metoprolol.  Anxiety Stable. Uses PRN Xanax. Needs refill.   Back pain Low back pain (bilateral) for the past few days. Recently washed her car and spent a lot of time bent over. Also has done some yard work recently. She is concerned that it is her "kidneys". Pain was quite severe last night. Has not responded to OTC Ibuprofen. No radicular symptoms.  Cough Reports that she constantly feels like there is mucous in her throat. Feels like she has to cough it up. Chronic. No medications tried. No fever or other associated symptoms.   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   Hypertension    Anxiety     Social Hx   Social History   Socioeconomic History   Marital status: Divorced    Spouse name: Not on file   Number of children: 1   Years of education: COLLEGE   Highest education level: Not on file  Occupational History    Employer: [employer]  Tobacco Use   Smoking status: Former    Packs/day: 1.00    Years: 21.00    Pack years: 21.00    Types: Cigarettes    Quit date: 06/22/1988    Years since quitting: 32.8   Smokeless tobacco: Never    Tobacco comments:    stopped a long time ago  Substance and Sexual Activity   Alcohol use: No   Drug use: No   Sexual activity: Not Currently    Birth control/protection: Abstinence  Other Topics Concern   Not on file  Social History Narrative   Patient is right handed.   Patient drinks 2 cups caffeine daily   Social Determinants of Health   Financial Resource Strain: Not on file  Food Insecurity: Not on file  Transportation Needs: Not on file  Physical Activity: Not on file  Stress: Not on file  Social Connections: Not on file    Review of Systems Per HPI  Objective:  BP 134/83   Pulse 77   Ht 5\' 4"  (1.626 m)   Wt 139 lb 12.8 oz (63.4 kg)   SpO2 97%   BMI 24.00 kg/m   BP/Weight 04/17/2021 10/22/2020 07/12/2020  Systolic BP 134 119 138  Diastolic BP 83 78 86  Wt. (Lbs) 139.8 146 144.6  BMI 24 25.06 24.82    Physical Exam Vitals and nursing note reviewed.  Constitutional:      General: She is not in acute distress.    Appearance: Normal appearance. She is not ill-appearing.  HENT:     Head: Normocephalic and atraumatic.  Eyes:     General:  Right eye: No discharge.        Left eye: No discharge.     Conjunctiva/sclera: Conjunctivae normal.  Cardiovascular:     Rate and Rhythm: Normal rate and regular rhythm.  Pulmonary:     Effort: Pulmonary effort is normal.     Breath sounds: Normal breath sounds. No wheezing or rales.  Musculoskeletal:     Comments: Mild tenderness of the lumbar paraspinal musculature.  Neurological:     Mental Status: She is alert.    Lab Results  Component Value Date   WBC 4.3 07/31/2015   HGB 13.5 07/31/2015   HCT 41.3 07/31/2015   PLT 336 07/31/2015   GLUCOSE 70 10/22/2020   CHOL 238 (H) 10/22/2020   TRIG 279 (H) 10/22/2020   HDL 82 10/22/2020   LDLCALC 109 (H) 10/22/2020   ALT 16 10/22/2020   AST 19 10/22/2020   NA 143 10/22/2020   K 4.6 10/22/2020   CL 101 10/22/2020   CREATININE 0.76 10/22/2020   BUN 17  10/22/2020   CO2 27 10/22/2020   TSH 1.350 07/31/2015     Assessment & Plan:   Problem List Items Addressed This Visit       Cardiovascular and Mediastinum   Hypertension    Stable.  Continue metoprolol.  Refilled today.      Relevant Medications   metoprolol tartrate (LOPRESSOR) 50 MG tablet     Digestive   Gastroesophageal reflux disease without esophagitis   Relevant Medications   esomeprazole (NEXIUM) 40 MG capsule     Other   Post-nasal drip    Patient's respiratory symptoms consistent with postnasal drip.  Treating with Flonase and Zyrtec.      Low back pain    Patient's back pain is MSK in nature.  Likely from recent activity. Advised heat and continued use of over-the-counter ibuprofen or naproxen.      Hyperlipidemia   Relevant Medications   metoprolol tartrate (LOPRESSOR) 50 MG tablet   Anxiety    Stable.  Patient's alprazolam was refilled.      Relevant Medications   ALPRAZolam (XANAX) 1 MG tablet   Other Visit Diagnoses     Palpitations       Relevant Medications   metoprolol tartrate (LOPRESSOR) 50 MG tablet       Meds ordered this encounter  Medications   cetirizine (ZYRTEC ALLERGY) 10 MG tablet    Sig: Take 1 tablet (10 mg total) by mouth daily.    Dispense:  90 tablet    Refill:  1   fluticasone (FLONASE) 50 MCG/ACT nasal spray    Sig: Place 2 sprays into both nostrils daily.    Dispense:  16 g    Refill:  6   ALPRAZolam (XANAX) 1 MG tablet    Sig: TAKE 1/2- 1 TABLET p.o. daily prn.    Dispense:  30 tablet    Refill:  2   esomeprazole (NEXIUM) 40 MG capsule    Sig: TAKE 1 CAPSULE(40 MG) BY MOUTH DAILY    Dispense:  30 capsule    Refill:  5   metoprolol tartrate (LOPRESSOR) 50 MG tablet    Sig: TAKE 1 TABLET(50 MG) BY MOUTH TWICE DAILY    Dispense:  60 tablet    Refill:  5    Follow-up:  Return 6 months to 1 year.  Everlene Other DO [facility]

## 2021-04-17 NOTE — Patient Instructions (Signed)
Stop the aspirin.  Medications as prescribed.  Follow up in 6 months to 1 year  Dr. Adriana Simas

## 2021-04-18 DIAGNOSIS — M545 Low back pain, unspecified: Secondary | ICD-10-CM | POA: Insufficient documentation

## 2021-04-18 DIAGNOSIS — R0982 Postnasal drip: Secondary | ICD-10-CM | POA: Insufficient documentation

## 2021-04-18 NOTE — Assessment & Plan Note (Signed)
Patient's back pain is MSK in nature.  Likely from recent activity. Advised heat and continued use of over-the-counter ibuprofen or naproxen.

## 2021-04-18 NOTE — Assessment & Plan Note (Signed)
Patient's respiratory symptoms consistent with postnasal drip.  Treating with Flonase and Zyrtec.

## 2021-04-18 NOTE — Assessment & Plan Note (Signed)
Stable.  Patient's alprazolam was refilled.

## 2021-04-18 NOTE — Assessment & Plan Note (Signed)
Stable. Continue metoprolol. Refilled today. 

## 2021-05-21 ENCOUNTER — Telehealth: Payer: Self-pay | Admitting: Family Medicine

## 2021-06-03 ENCOUNTER — Other Ambulatory Visit (INDEPENDENT_AMBULATORY_CARE_PROVIDER_SITE_OTHER): Payer: 59 | Admitting: *Deleted

## 2021-06-03 ENCOUNTER — Other Ambulatory Visit: Payer: Self-pay

## 2021-06-03 DIAGNOSIS — Z23 Encounter for immunization: Secondary | ICD-10-CM | POA: Diagnosis not present

## 2021-06-04 NOTE — Telephone Encounter (Signed)
Please close

## 2021-06-23 ENCOUNTER — Emergency Department (HOSPITAL_COMMUNITY)
Admission: EM | Admit: 2021-06-23 | Discharge: 2021-06-23 | Disposition: A | Discharge status: Home / Self Care | Payer: 59 | Attending: Emergency Medicine | Admitting: Emergency Medicine

## 2021-06-23 ENCOUNTER — Encounter (HOSPITAL_COMMUNITY): Payer: Self-pay | Admitting: *Deleted

## 2021-06-23 DIAGNOSIS — R040 Epistaxis: Secondary | ICD-10-CM | POA: Diagnosis not present

## 2021-06-23 DIAGNOSIS — R9431 Abnormal electrocardiogram [ECG] [EKG]: Secondary | ICD-10-CM | POA: Diagnosis not present

## 2021-06-23 DIAGNOSIS — Z79899 Other long term (current) drug therapy: Secondary | ICD-10-CM | POA: Diagnosis not present

## 2021-06-23 DIAGNOSIS — Z7982 Long term (current) use of aspirin: Secondary | ICD-10-CM | POA: Insufficient documentation

## 2021-06-23 LAB — CBC WITH DIFFERENTIAL/PLATELET
Abs Immature Granulocytes: 0.03 10*3/uL (ref 0.00–0.07)
Basophils Absolute: 0 10*3/uL (ref 0.0–0.1)
Basophils Relative: 1 %
Eosinophils Absolute: 0 10*3/uL (ref 0.0–0.5)
Eosinophils Relative: 0 %
HCT: 43.2 % (ref 36.0–46.0)
Hemoglobin: 13.9 g/dL (ref 12.0–15.0)
Immature Granulocytes: 0 %
Lymphocytes Relative: 18 %
Lymphs Abs: 1.3 10*3/uL (ref 0.7–4.0)
MCH: 30.7 pg (ref 26.0–34.0)
MCHC: 32.2 g/dL (ref 30.0–36.0)
MCV: 95.4 fL (ref 80.0–100.0)
Monocytes Absolute: 0.4 10*3/uL (ref 0.1–1.0)
Monocytes Relative: 5 %
Neutro Abs: 5.7 10*3/uL (ref 1.7–7.7)
Neutrophils Relative %: 76 %
Platelets: 376 10*3/uL (ref 150–400)
RBC: 4.53 MIL/uL (ref 3.87–5.11)
RDW: 12.2 % (ref 11.5–15.5)
WBC: 7.5 10*3/uL (ref 4.0–10.5)
nRBC: 0 % (ref 0.0–0.2)

## 2021-06-23 NOTE — ED Notes (Signed)
Patient states she is very weak after her nosebleed episode.

## 2021-06-23 NOTE — ED Provider Notes (Signed)
[facility] EMERGENCY DEPARTMENT Provider Note   CSN: [id number] Arrival date & time: 06/23/21  1835     History  Chief Complaint  Patient presents with   Epistaxis    Yvonne [name] is a 66 y.o. female presenting for evaluation after nosebleed.  Patient states she was at home tonight when she developed bleeding from her right nare.  After 10 to 20 minutes of holding firm pressure, bleeding stopped.  Patient does not have a history of nosebleeds.  No headache associated.  While she was having lots of bleeding, patient became dizzy and lightheaded, but she states the symptoms resolved.  No chest pain or shortness of breath.  She is not on any blood thinners.  HPI     Home Medications Prior to Admission medications   Medication Sig Start Date End Date Taking? Authorizing Provider  ALPRAZolam (XANAX) 1 MG tablet TAKE 1/2- 1 TABLET p.o. daily prn. 04/17/21   Tommie Sams, DO  aspirin EC 81 MG tablet Take 81 mg by mouth daily.    [provider]  cetirizine (ZYRTEC ALLERGY) 10 MG tablet Take 1 tablet (10 mg total) by mouth daily. 04/17/21   Tommie Sams, DO  esomeprazole (NEXIUM) 40 MG capsule TAKE 1 CAPSULE(40 MG) BY MOUTH DAILY 04/17/21   Cook, Jayce G, DO  fluticasone (FLONASE) 50 MCG/ACT nasal spray Place 2 sprays into both nostrils daily. 04/17/21   Tommie Sams, DO  metoprolol tartrate (LOPRESSOR) 50 MG tablet TAKE 1 TABLET(50 MG) BY MOUTH TWICE DAILY 04/17/21   Tommie Sams, DO  Multiple Vitamin (MULTIVITAMIN) tablet Take 1 tablet by mouth daily.    [provider]      Allergies    Hydrocodone    Review of Systems   Review of Systems  HENT:  Positive for nosebleeds.   Neurological:  Positive for dizziness (resolved) and light-headedness (resolved).  All other systems reviewed and are negative.  Physical Exam Updated Vital Signs BP (!) 153/91    Pulse 69    Temp 98.2 F (36.8 C) (Oral)    Resp 17    SpO2 97%  Physical Exam Vitals and nursing  note reviewed.  Constitutional:      General: She is not in acute distress.    Appearance: Normal appearance.  HENT:     Head: Normocephalic and atraumatic.     Comments: Dried blood around the nare.  No active epistaxis Eyes:     Conjunctiva/sclera: Conjunctivae normal.     Pupils: Pupils are equal, round, and reactive to light.  Cardiovascular:     Rate and Rhythm: Normal rate and regular rhythm.     Pulses: Normal pulses.  Pulmonary:     Effort: Pulmonary effort is normal. No respiratory distress.     Breath sounds: Normal breath sounds. No wheezing.     Comments: Speaking in full sentences.  Clear lung sounds in all fields. Abdominal:     General: There is no distension.     Palpations: Abdomen is soft. There is no mass.     Tenderness: There is no abdominal tenderness. There is no guarding or rebound.  Musculoskeletal:        General: Normal range of motion.     Cervical back: Normal range of motion and neck supple.  Skin:    General: Skin is warm and dry.     Capillary Refill: Capillary refill takes less than 2 seconds.  Neurological:     Mental Status:  She is alert and oriented to person, place, and time.  Psychiatric:        Mood and Affect: Mood and affect normal.        Speech: Speech normal.        Behavior: Behavior normal.    ED Results / Procedures / Treatments   Labs (all labs ordered are listed, but only abnormal results are displayed) Labs Reviewed  CBC WITH DIFFERENTIAL/PLATELET    EKG EKG Interpretation  Date/Time:  Monday June 23 2021 20:53:40 EST Ventricular Rate:  67 PR Interval:  117 QRS Duration: 90 QT Interval:  404 QTC Calculation: 427 R Axis:   79 Text Interpretation: Sinus rhythm Borderline short PR interval Baseline wander in lead(s) II III aVF V6 No old tracing to compare Confirmed by Susy Frizzle [phone]) on 06/23/2021 9:45:55 PM  Radiology No results found.  Procedures Procedures    Medications Ordered in ED Medications  - No data to display  ED Course/ Medical Decision Making/ A&P                           Medical Decision Making   This patient presents to the ED for concern of nosebleed. This involves a number of treatment options, and is a complaint that carries with it a moderate risk of complications and morbidity.    Co morbidities:  Not on blood thinners  Lab Tests:  I ordered, and personally interpreted labs.  The pertinent results include:  normal hgb. EKG nonischemic and without arrhythmia.  radiologist interpretation   Reevaluation:  Pt without recurrent nosebleed. No further dizziness or lightheadedness. Negative orthostatics.    Dispostion:  After consideration of the diagnostic results and the patients response to treatment, I feel that the patent would benefit from d/c home with pcp f/u as needed.  Discussed findings and plan with patient.  At this time, patient appears safe for discharge.  Return precautions given.  Patient states she understands and agrees to plan  Final Clinical Impression(s) / ED Diagnoses Final diagnoses:  Epistaxis    Rx / DC Orders ED Discharge Orders     None         Alveria Apley, PA-C 06/23/21 2244    Pollyann Savoy, MD 06/24/21 2228

## 2021-06-23 NOTE — ED Triage Notes (Signed)
C/o nosebleed prior to arrival

## 2021-06-23 NOTE — Discharge Instructions (Signed)
If your nose bleeds again, hold firm constant pressure until it stops. Return to emergency room if it is not stopping. Return to the emergency room with any new, worsening, concerning symptoms

## 2021-07-11 ENCOUNTER — Encounter: Payer: Self-pay | Admitting: Family Medicine

## 2021-07-11 ENCOUNTER — Ambulatory Visit (INDEPENDENT_AMBULATORY_CARE_PROVIDER_SITE_OTHER): Payer: 59 | Admitting: Family Medicine

## 2021-07-11 ENCOUNTER — Other Ambulatory Visit: Payer: Self-pay

## 2021-07-11 VITALS — HR 74 | Temp 98.3°F | Ht 64.0 in | Wt 139.0 lb

## 2021-07-11 DIAGNOSIS — R040 Epistaxis: Secondary | ICD-10-CM | POA: Diagnosis not present

## 2021-07-11 MED ORDER — OXYMETAZOLINE HCL 0.05 % NA SOLN: 1.0000 | Freq: Two times a day (BID) | NASAL | 0 refills | Status: DC

## 2021-07-11 MED ORDER — SALINE SPRAY 0.65 % NA SOLN: 1.0000 | Freq: Two times a day (BID) | NASAL | 0 refills | Status: DC

## 2021-07-11 NOTE — Progress Notes (Signed)
Subjective:  Patient ID: Yvonne [name], female    DOB: 26-Apr-1956  Age: 66 y.o. MRN: [id number]  CC: Chief Complaint  Patient presents with   Epistaxis    Er follow up    HPI:  66 year old female presents with recurrent nosebleeds.  Had anterior nosebleed on 1/2. Seen in the ER. Has since had additional nosebleeds. Most recently yesterday and earlier today. Patient states that she uses compression and she is able to resolve the nosebleeds. No known inciting factor. No prior history of nosebleeds until recently.   Patient also reports 1 year history of sharp, intermittent stabbing pains of head. She is concern that this is related to nosebleeds.  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   Hypertension    Anxiety     Social Hx   Social History   Socioeconomic History   Marital status: Divorced    Spouse name: Not on file   Number of children: 1   Years of education: COLLEGE   Highest education level: Not on file  Occupational History    Employer: [employer]  Tobacco Use   Smoking status: Former    Packs/day: 1.00    Years: 21.00    Pack years: 21.00    Types: Cigarettes    Quit date: 06/22/1988    Years since quitting: 33.0   Smokeless tobacco: Never   Tobacco comments:    stopped a long time ago  Substance and Sexual Activity   Alcohol use: No   Drug use: No   Sexual activity: Not Currently    Birth control/protection: Abstinence  Other Topics Concern   Not on file  Social History Narrative   Patient is right handed.   Patient drinks 2 cups caffeine daily   Social Determinants of Health   Financial Resource Strain: Not on file  Food Insecurity: Not on file  Transportation Needs: Not on file  Physical Activity: Not on file   Stress: Not on file  Social Connections: Not on file    Review of Systems Per HPI  Objective:  Pulse 74    Temp 98.3 F (36.8 C)    Ht 5\' 4"  (1.626 m)    Wt 139 lb (63 kg)    SpO2 97%    BMI 23.86 kg/m   BP/Weight 07/11/2021 06/23/2021 [id number]  Systolic BP - 0000000 [id number]  Diastolic BP - 91 83  Wt. (Lbs) 139 - 139.8  BMI 23.86 - 24    Physical Exam Constitutional:      General: She is not in acute distress.    Appearance: Normal appearance. She is not ill-appearing.  HENT:     Head: Normocephalic and atraumatic.     Nose: Nose normal.  Cardiovascular:     Rate and Rhythm: Normal rate and regular rhythm.  Pulmonary:     Effort: Pulmonary effort is normal.     Breath sounds: Normal breath sounds.  Neurological:     Mental Status: She is alert.  Psychiatric:        Mood and Affect: Mood normal.        Behavior: Behavior normal.    Lab Results  Component Value Date   WBC 7.5 06/23/2021   HGB 13.9 06/23/2021   HCT 43.2 06/23/2021   PLT  376 06/23/2021   GLUCOSE 70 10/22/2020   CHOL 238 (H) 10/22/2020   TRIG 279 (H) 10/22/2020   HDL 82 10/22/2020   LDLCALC 109 (H) 10/22/2020   ALT 16 10/22/2020   AST 19 10/22/2020   NA 143 10/22/2020   K 4.6 10/22/2020   CL 101 10/22/2020   CREATININE 0.76 10/22/2020   BUN 17 10/22/2020   CO2 27 10/22/2020   TSH 1.350 07/31/2015     Assessment & Plan:   Problem List Items Addressed This Visit       Other   Recurrent epistaxis - Primary    Nasal saline daily; Afrin for the next few days and with/after nose bleeds.  Referring to ENT.      Relevant Medications   sodium chloride (OCEAN) 0.65 % SOLN nasal spray   oxymetazoline (AFRIN NASAL SPRAY) 0.05 % nasal spray   Other Relevant Orders   Ambulatory referral to ENT    Meds ordered this encounter  Medications   sodium chloride (OCEAN) 0.65 % SOLN nasal spray    Sig: Place 1 spray into both nostrils 2 (two) times daily.    Dispense:  60 mL    Refill:  0    oxymetazoline (AFRIN NASAL SPRAY) 0.05 % nasal spray    Sig: Place 1 spray into both nostrils 2 (two) times daily. For the next 3 days then as needed with nosebleed.    Dispense:  30 mL    Refill:  [place]

## 2021-07-11 NOTE — Patient Instructions (Signed)
Medication as directed.  I placed the referral to ENT.  Tylenol as needed for headache.

## 2021-07-13 DIAGNOSIS — R040 Epistaxis: Secondary | ICD-10-CM | POA: Insufficient documentation

## 2021-07-13 NOTE — Assessment & Plan Note (Signed)
Nasal saline daily; Afrin for the next few days and with/after nose bleeds.  Referring to ENT.

## 2021-07-17 ENCOUNTER — Telehealth: Payer: Self-pay | Admitting: Family Medicine

## 2021-07-17 DIAGNOSIS — K219 Gastro-esophageal reflux disease without esophagitis: Secondary | ICD-10-CM

## 2021-07-17 DIAGNOSIS — R002 Palpitations: Secondary | ICD-10-CM

## 2021-07-17 DIAGNOSIS — I10 Essential (primary) hypertension: Secondary | ICD-10-CM

## 2021-07-17 MED ORDER — METOPROLOL TARTRATE 50 MG PO TABS: ORAL_TABLET | ORAL | 5 refills | Status: DC

## 2021-07-17 MED ORDER — ESOMEPRAZOLE MAGNESIUM 40 MG PO CPDR: DELAYED_RELEASE_CAPSULE | ORAL | 5 refills | Status: DC

## 2021-07-17 NOTE — Telephone Encounter (Signed)
Patient is wanting medication switched from [facility] to [facility] due to insurance change. Esomeprazole 40 mg, Metrprolol tartrate 50 mg, Alprazolam 1 mg.

## 2021-07-18 ENCOUNTER — Other Ambulatory Visit: Payer: Self-pay | Admitting: Family Medicine

## 2021-07-18 DIAGNOSIS — F419 Anxiety disorder, unspecified: Secondary | ICD-10-CM

## 2021-07-18 MED ORDER — ALPRAZOLAM 1 MG PO TABS: ORAL_TABLET | ORAL | 2 refills | Status: DC

## 2021-09-17 ENCOUNTER — Other Ambulatory Visit: Payer: Self-pay | Admitting: Family Medicine

## 2021-09-17 DIAGNOSIS — Z1231 Encounter for screening mammogram for malignant neoplasm of breast: Secondary | ICD-10-CM

## 2021-09-19 ENCOUNTER — Ambulatory Visit (INDEPENDENT_AMBULATORY_CARE_PROVIDER_SITE_OTHER): Payer: 59 | Admitting: Nurse Practitioner

## 2021-09-19 VITALS — BP 156/82 | HR 70 | Temp 98.1°F | Ht 64.0 in | Wt 138.0 lb

## 2021-09-19 DIAGNOSIS — J069 Acute upper respiratory infection, unspecified: Secondary | ICD-10-CM | POA: Diagnosis not present

## 2021-09-19 DIAGNOSIS — N6313 Unspecified lump in the right breast, lower outer quadrant: Secondary | ICD-10-CM

## 2021-09-19 DIAGNOSIS — R922 Inconclusive mammogram: Secondary | ICD-10-CM

## 2021-09-19 MED ORDER — AZITHROMYCIN 250 MG PO TABS: ORAL_TABLET | ORAL | 0 refills | Status: DC

## 2021-09-19 NOTE — Progress Notes (Signed)
? ?  Subjective:  ? ? Patient ID: Yvonne [name], female    DOB: 01/02/1956, 66 y.o.   MRN: [id number] ? ?HPI ?Breast exam and 3D MAMMO order ?Nasal drainage  x 2 weeks ? ? ?Review of Systems ? ?   ?Objective:  ? Physical Exam ? ? ? ? ?   ?Assessment & Plan:  ? ? ?

## 2021-09-19 NOTE — Progress Notes (Signed)
? ?Subjective:  ? ? Patient ID: Yvonne [name], female    DOB: 27-Nov-1955, 66 y.o.   MRN: [id number] ? ?HPI ?Patient presents with c/o localized right breast pain and tenderness with touch and movement. Noticed "lumps" in the right breast for over several months. Denies nipple discharge, erythema, edema or discoloration. No personal or family history of breast cancer. Had diagnostic mammogram of the left breast in 2020 which was normal.  ?Report been around three sick people and has since been experiencing rhinorrhea,congestion occasional cough with whitish sputum. Denies fever, and sore throat. Overall states she feels "sick" which she relates to coming in contact with several ill people.  ? ? ?Review of Systems  ?Constitutional:  Negative for chills and fever.  ?HENT:  Positive for rhinorrhea. Negative for mouth sores and sore throat.   ?Respiratory:  Positive for cough. Negative for chest tightness and shortness of breath.   ?Cardiovascular:  Negative for chest pain.  ?Gastrointestinal:  Negative for diarrhea, nausea and vomiting.  ? ?   ?Objective:  ? Physical Exam ?Vitals and nursing note reviewed. Exam conducted with a chaperone present.  ?Constitutional:   ?   General: She is not in acute distress. ?   Appearance: Normal appearance.  ?HENT:  ?   Head:  ?   Comments: Bilateral TMs retracted, no erythema noted. ?Neck:  ?   Comments:  Thyroid non tender to palpation. No mass or goiter noted.  ?Cardiovascular:  ?   Rate and Rhythm: Normal rate and regular rhythm.  ?   Pulses: Normal pulses.  ?   Heart sounds: No murmur heard. ?Pulmonary:  ?   Effort: Pulmonary effort is normal. No respiratory distress.  ?   Breath sounds: Normal breath sounds. No wheezing.  ?Chest:  ?Breasts: ?   Right: Mass and tenderness present. No inverted nipple, nipple discharge or skin change.  ?   Left: No inverted nipple, mass, nipple discharge, skin change or tenderness.  ? ? ?   Comments: Approximately 2 cm mobile but firm mass noted   between 6 and 7 o'clock position of right breast with multiple small diffuse nodularity noted.  ?Lymphadenopathy:  ?   Upper Body:  ?   Right upper body: No supraclavicular, axillary or pectoral adenopathy.  ?   Left upper body: No supraclavicular, axillary or pectoral adenopathy.  ?Skin: ?   General: Skin is warm.  ?Neurological:  ?   Mental Status: She is alert.  ?Psychiatric:     ?   Mood and Affect: Mood normal.     ?   Behavior: Behavior normal.     ?   Thought Content: Thought content normal.     ?   Judgment: Judgment normal.  ? ? ?Today's Vitals  ? 09/19/21 1548  ?BP: (!) 156/82  ?Pulse: 70  ?Temp: 98.1 ?F (36.7 ?C)  ?SpO2: 99%  ?Weight: 138 lb (62.6 kg)  ?Height: 5\' 4"  (1.626 m)  ? ?Body mass index is 23.69 kg/m?.  ?Tyrer Cusick 6.9% lifetime risk  ?   ?Assessment & Plan:  ? ?Problem List Items Addressed This Visit   ? ?  ? Other  ? Dense breasts  ? ?Other Visit Diagnoses   ? ? Mass of lower outer quadrant of right breast    -  Primary  ? Relevant Orders  ? BREAST LTD UNI RIGHT INC AXILLA  ? MM DIAG BREAST TOMO BILATERAL  ? Viral URI      ?  Relevant Medications  ? azithromycin (ZITHROMAX Z-PAK) 250 MG tablet  ? ?  ?  ?Meds ordered this encounter  ?Medications  ? azithromycin (ZITHROMAX Z-PAK) 250 MG tablet  ?  Sig: Take 2 tablets (500 mg) on  Day 1,  followed by 1 tablet (250 mg) once daily on Days 2 through 5.  ?  Dispense:  6 each  ?  Refill:  0  ?  Hold unless patient calls. Thanks.  ?  Order Specific Question:   Supervising Provider  ?  Answer:   Lilyan Punt A [[id number]]  ?OTC meds as directed for symptomatic care. Lengthy discussion about the use of antibiotics with her current symptoms. Zpack sent in to pharmacy to have on hold for the weekend. Reviewed warning signs. Call back if worsens or persists.  ?Mammogram ordered including diagnostic of the right breast. Patient reassured about use of 3D mammogram.   ?Further follow up based on mammo and [place] results.  ? ?I have personally spent 40 minutes  involved in face-to-face and non-face-to-face activities for this patient on the day of the visit. Professional time spent includes the following activities, in addition to those noted in the documentation: preparing to see the patient (eg. review of test), performing a medically appropriate examination and/or evaluation, counseling and educating the patient/ family/ caregiver, ordering medications, tests or procedures, documenting clinical information in the electronic or other health record. Sherie Don, FNP ? ? ?  ?

## 2021-09-20 ENCOUNTER — Encounter: Payer: Self-pay | Admitting: Nurse Practitioner

## 2021-10-01 ENCOUNTER — Ambulatory Visit
Admission: RE | Admit: 2021-10-01 | Discharge: 2021-10-01 | Disposition: A | Discharge status: Home / Self Care | Payer: 59 | Source: Ambulatory Visit | Attending: Nurse Practitioner | Admitting: Nurse Practitioner

## 2021-10-01 DIAGNOSIS — R922 Inconclusive mammogram: Secondary | ICD-10-CM | POA: Diagnosis not present

## 2021-10-15 ENCOUNTER — Other Ambulatory Visit: Payer: Self-pay | Admitting: Family Medicine

## 2021-10-15 DIAGNOSIS — K219 Gastro-esophageal reflux disease without esophagitis: Secondary | ICD-10-CM

## 2021-10-16 ENCOUNTER — Telehealth: Payer: Self-pay | Admitting: *Deleted

## 2021-10-16 ENCOUNTER — Other Ambulatory Visit: Payer: Self-pay | Admitting: Family Medicine

## 2021-10-16 DIAGNOSIS — K219 Gastro-esophageal reflux disease without esophagitis: Secondary | ICD-10-CM

## 2021-10-16 NOTE — Telephone Encounter (Signed)
PA for Esomeprazole 40mg  approved by insurance 10/16/21-10/17/22 ? ?Patient notified ?

## 2021-12-10 ENCOUNTER — Other Ambulatory Visit: Payer: Self-pay | Admitting: Family Medicine

## 2021-12-10 DIAGNOSIS — K219 Gastro-esophageal reflux disease without esophagitis: Secondary | ICD-10-CM

## 2022-03-22 ENCOUNTER — Other Ambulatory Visit: Payer: Self-pay | Admitting: Family Medicine

## 2022-03-22 DIAGNOSIS — F419 Anxiety disorder, unspecified: Secondary | ICD-10-CM

## 2022-03-25 DIAGNOSIS — M542 Cervicalgia: Secondary | ICD-10-CM | POA: Diagnosis not present

## 2022-03-26 ENCOUNTER — Other Ambulatory Visit: Payer: Self-pay | Admitting: Orthopedic Surgery

## 2022-03-26 DIAGNOSIS — M542 Cervicalgia: Secondary | ICD-10-CM

## 2022-04-03 ENCOUNTER — Ambulatory Visit: Payer: 59 | Admitting: Family Medicine

## 2022-04-03 ENCOUNTER — Ambulatory Visit (INDEPENDENT_AMBULATORY_CARE_PROVIDER_SITE_OTHER): Payer: 59 | Admitting: Nurse Practitioner

## 2022-04-03 VITALS — BP 140/72 | HR 56 | Temp 98.1°F | Ht 64.0 in | Wt 136.0 lb

## 2022-04-03 DIAGNOSIS — G629 Polyneuropathy, unspecified: Secondary | ICD-10-CM

## 2022-04-03 DIAGNOSIS — E161 Other hypoglycemia: Secondary | ICD-10-CM | POA: Diagnosis not present

## 2022-04-03 DIAGNOSIS — R5383 Other fatigue: Secondary | ICD-10-CM | POA: Diagnosis not present

## 2022-04-03 NOTE — Progress Notes (Signed)
Subjective:    Patient ID: [name], female    DOB: 09-Sep-1955, 66 y.o.   MRN: [id number]  HPI Presents for complaints of numbness in the bilateral fingers and toes over the past month.  In addition states the tips of her fingers feel like ice, like she has submerged them in snow.  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  Continues to experience some of these episodes where she feels dizzy and lightheaded, no actual syncope.  Denies any numbness or weakness of the face arms or legs.  No visual changes.  Has been seeing an orthopedic specialist for her neck pain, still trying to get an MRI approved by her insurance.  States she has not discussed these current symptoms with her orthopedic specialist.  Denies any oral lesions around the mouth or tongue.  No soreness.  Generalized fatigue and weakness at times.  States she craves sweets but has tried to cut back since this seems to help her dizziness episodes.  Has started a B12 and vitamin D supplement for the past 4 days.   Review of Systems  Constitutional:  Positive for fatigue. Negative for fever.  HENT:  Negative for sore throat and trouble swallowing.   Respiratory:  Negative for chest tightness and shortness of breath.   Cardiovascular:  Negative for chest pain and leg swelling.  Neurological:  Positive for dizziness, light-headedness and numbness. Negative for syncope, facial asymmetry, speech difficulty and weakness.       Objective:   Physical Exam NAD.  Alert, oriented.  Mildly anxious affect.  Making good eye contact.  Dressed appropriately for the weather.  Speech clear.  Thoughts logical coherent and relevant.  No oral or tongue lesions noted.  No cheilitis.  Thyroid nontender to palpation, no mass or goiter noted.  Lungs clear.  Heart regular rate rhythm.  Radial pulses strong bilaterally.  Fingers warm with normal capillary refill.  Hand and arm strength 5+ bilaterally.  Sensation  grossly intact.  Feet DP and PT pulses present bilaterally.  Toes warm with normal capillary refill.  Skin intact.  Monofilament test normal limit bilaterally.  Gait normal.  Gets on and off table without difficulty. Today's Vitals   04/03/22 1605 04/03/22 1650  BP: (!) 150/85 (!) 140/72  Pulse: (!) 56   Temp: 98.1 F (36.7 C)   SpO2: 97%   Weight: 136 lb (61.7 kg)   Height: 5\' 4"  (1.626 m)    Body mass index is 23.34 kg/m.      Assessment & Plan:   Problem List Items Addressed This Visit       Digestive   Reactive hypoglycemia   Relevant Orders   Ferritin   Vitamin B12   VITAMIN D 25 Hydroxy (Vit-D Deficiency, Fractures)   TSH   Hemoglobin A1c     Nervous and Auditory   Peripheral polyneuropathy - Primary   Relevant Orders   Ferritin   Vitamin B12   VITAMIN D 25 Hydroxy (Vit-D Deficiency, Fractures)   TSH   Hemoglobin A1c   Other Visit Diagnoses     Fatigue, unspecified type       Relevant Orders   Ferritin   Vitamin B12   VITAMIN D 25 Hydroxy (Vit-D Deficiency, Fractures)   TSH   Hemoglobin A1c      Recommend that patient avoid as much simple carbs as possible due to history of reactive hypoglycemia.  Lengthy discussion regarding the diagnosis  of diabetes and how this is different from hypoglycemia.  Also discussed potential causes of peripheral neuropathy in her fingers and toes.  Initial lab work ordered to start ruling out, problems of these conditions.  It is unlikely that her neck issues are causing problems in both her hands and feet bilaterally. Further follow-up based on lab results. Warning signs reviewed.  Patient to call back sooner if needed.

## 2022-04-04 ENCOUNTER — Encounter: Payer: Self-pay | Admitting: Nurse Practitioner

## 2022-04-04 DIAGNOSIS — E161 Other hypoglycemia: Secondary | ICD-10-CM | POA: Insufficient documentation

## 2022-04-04 DIAGNOSIS — G629 Polyneuropathy, unspecified: Secondary | ICD-10-CM | POA: Insufficient documentation

## 2022-04-06 DIAGNOSIS — G629 Polyneuropathy, unspecified: Secondary | ICD-10-CM | POA: Diagnosis not present

## 2022-04-06 DIAGNOSIS — R5383 Other fatigue: Secondary | ICD-10-CM | POA: Diagnosis not present

## 2022-04-06 DIAGNOSIS — E161 Other hypoglycemia: Secondary | ICD-10-CM | POA: Diagnosis not present

## 2022-04-07 LAB — TSH: TSH: 0.756 u[IU]/mL (ref 0.450–4.500)

## 2022-04-07 LAB — VITAMIN D 25 HYDROXY (VIT D DEFICIENCY, FRACTURES): Vit D, 25-Hydroxy: 38.3 ng/mL (ref 30.0–100.0)

## 2022-04-07 LAB — VITAMIN B12: Vitamin B-12: 973 pg/mL (ref 232–1245)

## 2022-04-07 LAB — FERRITIN: Ferritin: 74 ng/mL (ref 15–150)

## 2022-04-07 LAB — HEMOGLOBIN A1C
Est. average glucose Bld gHb Est-mCnc: 120 mg/dL
Hgb A1c MFr Bld: 5.8 % — ABNORMAL HIGH (ref 4.8–5.6)

## 2022-04-14 ENCOUNTER — Other Ambulatory Visit: Payer: Self-pay

## 2022-04-14 DIAGNOSIS — G629 Polyneuropathy, unspecified: Secondary | ICD-10-CM

## 2022-04-22 ENCOUNTER — Ambulatory Visit
Admission: RE | Admit: 2022-04-22 | Discharge: 2022-04-22 | Disposition: A | Discharge status: Home / Self Care | Payer: 59 | Source: Ambulatory Visit | Attending: Orthopedic Surgery | Admitting: Orthopedic Surgery

## 2022-04-22 DIAGNOSIS — M4802 Spinal stenosis, cervical region: Secondary | ICD-10-CM | POA: Diagnosis not present

## 2022-04-22 DIAGNOSIS — M542 Cervicalgia: Secondary | ICD-10-CM

## 2022-05-25 ENCOUNTER — Ambulatory Visit (INDEPENDENT_AMBULATORY_CARE_PROVIDER_SITE_OTHER): Payer: 59 | Admitting: Family Medicine

## 2022-05-25 ENCOUNTER — Ambulatory Visit (HOSPITAL_COMMUNITY)
Admission: RE | Admit: 2022-05-25 | Discharge: 2022-05-25 | Disposition: A | Discharge status: Home / Self Care | Payer: 59 | Source: Ambulatory Visit | Attending: Family Medicine | Admitting: Family Medicine

## 2022-05-25 ENCOUNTER — Encounter: Payer: Self-pay | Admitting: Family Medicine

## 2022-05-25 VITALS — BP 138/74 | HR 76 | Temp 98.0°F | Wt 133.0 lb

## 2022-05-25 DIAGNOSIS — J189 Pneumonia, unspecified organism: Secondary | ICD-10-CM | POA: Insufficient documentation

## 2022-05-25 DIAGNOSIS — R052 Subacute cough: Secondary | ICD-10-CM | POA: Insufficient documentation

## 2022-05-25 DIAGNOSIS — R059 Cough, unspecified: Secondary | ICD-10-CM | POA: Diagnosis not present

## 2022-05-25 MED ORDER — AZITHROMYCIN 250 MG PO TABS: ORAL_TABLET | ORAL | 0 refills | Status: AC

## 2022-05-25 MED ORDER — AMOXICILLIN-POT CLAVULANATE 875-125 MG PO TABS: 1.0000 | ORAL_TABLET | Freq: Two times a day (BID) | ORAL | 0 refills | Status: DC

## 2022-05-25 NOTE — Patient Instructions (Signed)
Xray today.  I will call with the results and I will send in treatment accordingly.  Take care  Dr. Adriana Simas

## 2022-05-25 NOTE — Assessment & Plan Note (Signed)
Acute illness with systemic symptoms.  Patient has had recent fever in addition to her respiratory symptoms.  Chest x-ray was obtained today and revealed findings consistent with pneumonia.  Treating with Augmentin and azithromycin.

## 2022-05-25 NOTE — Progress Notes (Signed)
Subjective:  Patient ID: Yvonne [name], female    DOB: October 17, 1955  Age: 66 y.o. MRN: [id number]  CC: Chief Complaint  Patient presents with   Cough    Cough,runny nose, chills, weak. Pt states she has been shaking/trembling when trying to walk. Feeling faint. Chest hurts under ribs. COVID test done about one week ago came up negative. Pt states this has been going on about 3 weeks. 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     HPI: 66 year old female presents with respiratory symptoms.  Patient states that she has been sick for 3 weeks.  She reports that she has had fever, cough, runny nose, sore throat, headache, generalized weakness, fatigue.  A lot of her symptoms have improved but she is still suffering from severe cough.  She reports associated rib discomfort and a cough.  Also reports that she continues to feel "weak".  She has had a recent sick contact with pneumonia.  She has had negative home COVID testing.  Patient is concerned about community-acquired pneumonia.  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   Hypertension    Anxiety     Social Hx   Social History   Socioeconomic History   Marital status: Divorced    Spouse name: Not on file   Number of children: 1   Years of education: COLLEGE   Highest education level: Not on file  Occupational History    Employer: [employer]  Tobacco Use   Smoking status: Former    Packs/day: 1.00    Years: 21.00    Total pack years: 21.00    Types: Cigarettes    Quit date: 06/22/1988    Years since quitting: 33.9   Smokeless tobacco:  Never   Tobacco comments:    stopped a long time ago  Substance and Sexual Activity   Alcohol use: No   Drug use: No   Sexual activity: Not Currently    Birth control/protection: Abstinence  Other Topics Concern   Not on file  Social History Narrative   Patient is right handed.   Patient drinks 2 cups caffeine daily   Social Determinants of Health   Financial Resource Strain: Not on file  Food Insecurity: Not on file  Transportation Needs: Not on file  Physical Activity: Not on file  Stress: Not on file  Social Connections: Not on file    Review of Systems Per HPI  Objective:  BP 138/74   Pulse 76   Temp 98 F (36.7 C)   Wt 133 lb (60.3 kg)   SpO2 98%   BMI 22.83 kg/m      05/25/2022   10:47 AM 04/03/2022    4:50 PM 04/03/2022    4:05 PM  BP/Weight  Systolic BP 138 140 150  Diastolic BP 74 72 85  Wt. (Lbs) 133  136  BMI 22.83 kg/m2  23.34 kg/m2    Physical Exam Vitals and nursing note reviewed.  Constitutional:      General: She is not in acute distress. HENT:     Head: Normocephalic and atraumatic.  Eyes:     General:  Right eye: No discharge.        Left eye: No discharge.     Conjunctiva/sclera: Conjunctivae normal.  Cardiovascular:     Rate and Rhythm: Normal rate and regular rhythm.  Pulmonary:     Effort: Pulmonary effort is normal.     Breath sounds: Normal breath sounds. No wheezing, rhonchi or rales.  Neurological:     Mental Status: She is alert.  Psychiatric:        Mood and Affect: Mood normal.        Behavior: Behavior normal.     Lab Results  Component Value Date   WBC 7.5 06/23/2021   HGB 13.9 06/23/2021   HCT 43.2 06/23/2021   PLT 376 06/23/2021   GLUCOSE 70 10/22/2020   CHOL 238 (H) 10/22/2020   TRIG 279 (H) 10/22/2020   HDL 82 10/22/2020   LDLCALC 109 (H) 10/22/2020   ALT 16 10/22/2020   AST 19 10/22/2020   NA 143 10/22/2020   K 4.6 10/22/2020   CL 101 10/22/2020   CREATININE 0.76 10/22/2020   BUN 17  10/22/2020   CO2 27 10/22/2020   TSH 0.756 04/06/2022   HGBA1C 5.8 (H) 04/06/2022     Assessment & Plan:   Problem List Items Addressed This Visit       Respiratory   Community acquired pneumonia - Primary    Acute illness with systemic symptoms.  Patient has had recent fever in addition to her respiratory symptoms.  Chest x-ray was obtained today and revealed findings consistent with pneumonia.  Treating with Augmentin and azithromycin.      Relevant Medications   amoxicillin-clavulanate (AUGMENTIN) 875-125 MG tablet   azithromycin (ZITHROMAX) 250 MG tablet   Other Visit Diagnoses     Subacute cough       Relevant Orders   DG Chest 2 View (Completed)       Meds ordered this encounter  Medications   amoxicillin-clavulanate (AUGMENTIN) 875-125 MG tablet    Sig: Take 1 tablet by mouth 2 (two) times daily.    Dispense:  14 tablet    Refill:  0   azithromycin (ZITHROMAX) 250 MG tablet    Sig: Take 2 tablets on day 1, then 1 tablet daily on days 2 through 5    Dispense:  6 tablet    Refill:  0    Follow-up:  Return if symptoms worsen or fail to improve.  Everlene Other DO [facility]

## 2022-06-13 ENCOUNTER — Other Ambulatory Visit: Payer: Self-pay | Admitting: Family Medicine

## 2022-06-13 DIAGNOSIS — K219 Gastro-esophageal reflux disease without esophagitis: Secondary | ICD-10-CM

## 2022-06-20 ENCOUNTER — Other Ambulatory Visit: Payer: Self-pay | Admitting: Family Medicine

## 2022-06-20 DIAGNOSIS — I10 Essential (primary) hypertension: Secondary | ICD-10-CM

## 2022-06-20 DIAGNOSIS — R002 Palpitations: Secondary | ICD-10-CM

## 2022-11-09 ENCOUNTER — Other Ambulatory Visit: Payer: Self-pay | Admitting: Family Medicine

## 2022-11-09 DIAGNOSIS — F419 Anxiety disorder, unspecified: Secondary | ICD-10-CM

## 2022-11-17 ENCOUNTER — Other Ambulatory Visit: Payer: Self-pay | Admitting: Family Medicine

## 2022-11-17 DIAGNOSIS — K219 Gastro-esophageal reflux disease without esophagitis: Secondary | ICD-10-CM

## 2023-01-19 ENCOUNTER — Other Ambulatory Visit: Payer: Self-pay | Admitting: Family Medicine

## 2023-01-19 DIAGNOSIS — R002 Palpitations: Secondary | ICD-10-CM

## 2023-01-19 DIAGNOSIS — I10 Essential (primary) hypertension: Secondary | ICD-10-CM

## 2023-01-20 ENCOUNTER — Telehealth: Payer: Self-pay

## 2023-01-20 NOTE — Telephone Encounter (Signed)
Reached out to patient to set up follow up visit with provider to discuss chronic conditions.  Telephone encounter attempt : 1   A HIPAA compliant voice message was left requesting a return call.  Instructed patient to call office or to call me at [phone].  Elijio Miles [facility] Health Specialist

## 2023-01-22 NOTE — Telephone Encounter (Signed)
Reached out  patient  set up follow up visit with provider  discuss chronic conditions.  Telephone encounter attempt : 2   A HIPAA compliant voice message was left requesting a return call.  Instructed patient  call office or  call me at [phone].  Elijio Miles [facility] Health Specialist

## 2023-03-26 ENCOUNTER — Other Ambulatory Visit: Payer: Self-pay | Admitting: Family Medicine

## 2023-03-26 DIAGNOSIS — Z1212 Encounter for screening for malignant neoplasm of rectum: Secondary | ICD-10-CM

## 2023-03-26 DIAGNOSIS — Z1211 Encounter for screening for malignant neoplasm of colon: Secondary | ICD-10-CM

## 2023-06-02 ENCOUNTER — Other Ambulatory Visit: Payer: Self-pay | Admitting: Family Medicine

## 2023-06-02 DIAGNOSIS — K219 Gastro-esophageal reflux disease without esophagitis: Secondary | ICD-10-CM

## 2023-06-02 DIAGNOSIS — F419 Anxiety disorder, unspecified: Secondary | ICD-10-CM

## 2023-06-02 NOTE — Telephone Encounter (Signed)
Copied from CRM [id number]. Topic: Clinical - Medication Refill >> Jun 02, 2023  4:10 PM Fuller Mandril wrote: Most Recent Primary Care Visit:  Provider: Tommie Sams  Department: [facility] MED  Visit Type: OFFICE VISIT  Date: 05/25/2022  Medication: esomeprazole (NEXIUM) 40 MG capsule  Has the patient contacted their pharmacy? Yes - pt is at the pharmacy and pharmacy has submitted request (Agent: If no, request that the patient contact the pharmacy for the refill. If patient does not wish to contact the pharmacy document the reason why and proceed with request.) (Agent: If yes, when and what did the pharmacy advise?)  Is this the correct pharmacy for this prescription? Yes If no, delete pharmacy and type the correct one.  This is the patient's preferred pharmacy:  [id number] - [place], [place] - [address] AT [facility] [address] [place] [address] Phone: [phone] Fax: [phone]   Has the prescription been filled recently? No  Is the patient out of the medication? Yes  Has the patient been seen for an appointment in the last year OR does the patient have an upcoming appointment? No  Can we respond through MyChart? No  Agent: Please be advised that Rx refills may take up to 3 business days. We ask that you follow-up with your pharmacy.

## 2023-06-03 MED ORDER — ESOMEPRAZOLE MAGNESIUM 40 MG PO CPDR: DELAYED_RELEASE_CAPSULE | ORAL | 5 refills | Status: DC

## 2023-06-30 ENCOUNTER — Encounter: Payer: Self-pay | Admitting: Family Medicine

## 2023-06-30 ENCOUNTER — Ambulatory Visit (INDEPENDENT_AMBULATORY_CARE_PROVIDER_SITE_OTHER): Payer: Medicare PPO | Admitting: Family Medicine

## 2023-06-30 ENCOUNTER — Ambulatory Visit: Payer: Medicare PPO | Admitting: Family Medicine

## 2023-06-30 VITALS — BP 120/72 | HR 79 | Temp 97.9°F | Ht 64.0 in | Wt 136.2 lb

## 2023-06-30 DIAGNOSIS — J988 Other specified respiratory disorders: Secondary | ICD-10-CM | POA: Diagnosis not present

## 2023-06-30 MED ORDER — AMOXICILLIN-POT CLAVULANATE 875-125 MG PO TABS: 1.0000 | ORAL_TABLET | Freq: Two times a day (BID) | ORAL | 0 refills | Status: DC

## 2023-06-30 NOTE — Progress Notes (Signed)
 Subjective:  Patient ID: Yvonne [name], female    DOB: [birth year]  Age: 68 y.o. MRN: [id number]  CC:   Chief Complaint  Patient presents with   Acute Visit    hristmas night she started with sore throat, Then started ears hurting, face hurting, gums and teeth hurting, chills, she does a way to take fever, cough, congestion - Not going away    HPI:  68 year old female presents with respiratory symptoms.  Patient reports that she has been sick for the past 2 weeks.  She has had sneezing, chills, sore throat, body aches, facial pain and pressure, congestion, dental pain, cough which has been productive, chills, earache.  Patient states that she has improved slightly but her symptoms continue to persist.  No current fever.  Grandchildren recently sick and she was exposed to them.  No home testing.  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   Hypertension    Anxiety     Social Hx   Social History   Socioeconomic History   Marital status: Divorced    Spouse name: Not on file   Number of children: 1   Years of education: COLLEGE   Highest education level: Not on file  Occupational History    Employer: [employer]  Tobacco Use   Smoking status: Former    Current packs/day: 0.00    Average packs/day: 1 pack/day for 21.0 years (21.0 ttl pk-yrs)    Types: Cigarettes    Start date: 06/23/1967    Quit date: 06/22/1988    Years since quitting: 35.0   Smokeless tobacco: Never   Tobacco comments:    stopped a long time ago  Substance and Sexual Activity   Alcohol use: No   Drug use: No   Sexual activity: Not Currently    Birth control/protection:  Abstinence  Other Topics Concern   Not on file  Social History Narrative   Patient is right handed.   Patient drinks 2 cups caffeine daily   Social Drivers of Corporate Investment Banker Strain: Not on file  Food Insecurity: Not on file  Transportation Needs: Not on file  Physical Activity: Not on file  Stress: Not on file  Social Connections: Not on file    Review of Systems Per HPI  Objective:  BP 120/72   Pulse 79   Temp 97.9 F (36.6 C)   Ht 5' 4 (1.626 m)   Wt 136 lb 3.2 oz (61.8 kg)   SpO2 99%   BMI 23.38 kg/m      06/30/2023    3:39 PM 05/25/2022   10:47 AM 04/03/2022    4:50 PM  BP/Weight  Systolic BP 120 138 140  Diastolic BP 72 74 72  Wt. (Lbs) 136.2 133   BMI 23.38 kg/m2 22.83 kg/m2     Physical Exam Constitutional:      General: She is not in acute distress.    Appearance: Normal appearance.  HENT:     Head: Normocephalic and atraumatic.     Right Ear: Tympanic membrane normal.     Left Ear: Tympanic membrane normal.     Mouth/Throat:     Pharynx: Oropharynx is clear.  Cardiovascular:  Rate and Rhythm: Normal rate and regular rhythm.  Pulmonary:     Effort: Pulmonary effort is normal.     Breath sounds: Normal breath sounds.  Neurological:     Mental Status: She is alert.     Lab Results  Component Value Date   WBC 7.5 06/23/2021   HGB 13.9 06/23/2021   HCT 43.2 06/23/2021   PLT 376 06/23/2021   GLUCOSE 70 10/22/2020   CHOL 238 (H) 10/22/2020   TRIG 279 (H) 10/22/2020   HDL 82 10/22/2020   LDLCALC 109 (H) 10/22/2020   ALT 16 10/22/2020   AST 19 10/22/2020   NA 143 10/22/2020   K 4.6 10/22/2020   CL 101 10/22/2020   CREATININE 0.76 10/22/2020   BUN 17 10/22/2020   CO2 27 10/22/2020   TSH 0.756 04/06/2022   HGBA1C 5.8 (H) 04/06/2022     Assessment & Plan:   Problem List Items Addressed This Visit       Respiratory   Respiratory infection - Primary   Patient likely has a secondary bacterial respiratory infection.   Placing on Augmentin .       Meds ordered this encounter  Medications   amoxicillin -clavulanate (AUGMENTIN ) 875-125 MG tablet    Sig: Take 1 tablet by mouth 2 (two) times daily.    Dispense:  20 tablet    Refill:  0    Follow-up:  Return if symptoms worsen or fail to improve.  Jacqulyn Ahle DO [facility]

## 2023-06-30 NOTE — Assessment & Plan Note (Signed)
 Patient likely has a secondary bacterial respiratory infection.  Placing on Augmentin.

## 2023-06-30 NOTE — Patient Instructions (Signed)
Rest. Fluids.  Antibiotic as prescribed.  Take care  Dr. Adriana Simas

## 2023-08-26 ENCOUNTER — Ambulatory Visit: Payer: Self-pay | Admitting: Family Medicine

## 2023-08-26 NOTE — Telephone Encounter (Signed)
 Copied from CRM [id number]. Topic: Clinical - Red Word Triage >> Aug 26, 2023  3:39 PM [place] B wrote: [facility] that prompted transfer to Nurse Triage: pt called in heart fluttering and pulsating in  stomach, feels like a lump in her stomach  Chief Complaint: heart fluttering  Symptoms: heart fluttering, pounding, fast, irregular, stomach pulsating  Frequency: 2 -3 weeks Pertinent Negatives: Patient denies n/v Disposition: [x] ED /[] Urgent Care (no appt availability in office) / [] Appointment(In office/virtual)/ []  [place] [facility]/ [] Home Care/ [] Refused Recommended Disposition /[] [place] [facility]/ []  Follow-up with PCP Additional Notes: pt stated heart at times feel like it is going to jump out of chest, c/o stomach pulsating - feels like something is moving in belly almost like a baby moving and kicking.  Referred pt to ER  Reason for Disposition  [1] Heart beating very rapidly (e.g., > 140 / minute) AND [2] present now  (Exception: During exercise.)    Heart fluttering, pounding, fast and stomach pulsating and feels like something is moving in stomach: like baby kicking and can feel lump in stomach  Answer Assessment - Initial Assessment Questions 1. DESCRIPTION: "Please describe your heart rate or heartbeat that you are having" (e.g., fast/slow, regular/irregular, skipped or extra beats, "palpitations")     Heart fluttering: pounding, fast and sometimes irregular 2. ONSET: "When did it start?" (Minutes, hours or days)      2 weeks  3. DURATION: "How long does it last" (e.g., seconds, minutes, hours)     2 weeks  4. PATTERN "Does it come and go, or has it been constant since it started?"  "Does it get worse with exertion?"   "Are you feeling it now?"     Comes and goes 5. TAP: "Using your hand, can you tap out what you are feeling on a chair or table in front of you, so that I can hear?" (Note: not all patients can do this)       N/a 6. HEART RATE: "Can you tell me your heart  rate?" "How many beats in 15 seconds?"  (Note: not all patients can do this)       Fast, pounding,  7. RECURRENT SYMPTOM: "Have you ever had this before?" If Yes, ask: "When was the last time?" and "What happened that time?"      no 8. CAUSE: "What do you think is causing the palpitations?"     unknown 9. CARDIAC HISTORY: "Do you have any history of heart disease?" (e.g., heart attack, angina, bypass surgery, angioplasty, arrhythmia)      unknown 10. OTHER SYMPTOMS: "Do you have any other symptoms?" (e.g., dizziness, chest pain, sweating, difficulty breathing) no 11. PREGNANCY: "Is there any chance you are pregnant?" "When was your last menstrual period?"       N/a  Protocols used: Heart Rate and Heartbeat Questions-A-AH

## 2023-08-27 NOTE — Telephone Encounter (Signed)
FYI patient going to ER.  

## 2023-09-13 ENCOUNTER — Other Ambulatory Visit: Payer: Self-pay | Admitting: Family Medicine

## 2023-09-13 DIAGNOSIS — F419 Anxiety disorder, unspecified: Secondary | ICD-10-CM

## 2023-09-20 ENCOUNTER — Encounter: Payer: Self-pay | Admitting: Family Medicine

## 2023-09-20 ENCOUNTER — Ambulatory Visit (INDEPENDENT_AMBULATORY_CARE_PROVIDER_SITE_OTHER): Admitting: Family Medicine

## 2023-09-20 VITALS — BP 124/76 | HR 78 | Temp 98.1°F | Ht 64.0 in | Wt 137.0 lb

## 2023-09-20 DIAGNOSIS — R198 Other specified symptoms and signs involving the digestive system and abdomen: Secondary | ICD-10-CM | POA: Insufficient documentation

## 2023-09-20 NOTE — Assessment & Plan Note (Signed)
 Likely reflects a normal abdominal aorta in someone who is thin.  However, given age and the fact that this is disconcerting to her, proceeding with further evaluation with ultrasound imaging.

## 2023-09-20 NOTE — Progress Notes (Signed)
 Subjective:  Patient ID: Yvonne [name], female    DOB: 02-19-1956  Age: 68 y.o. MRN: [id number]  CC:   Chief Complaint  Patient presents with   feeling heartbeat in lower abdomen    Feels like something is there that shouldn't be. X's a couple months. Would like scan ordered    HPI:  68 year old female presents for evaluation of the above.  Patient reports that for the past 2 to 3 months she has felt a pulsation in her abdomen particularly at night.  She has palpated the area.  She has went online to look up potential causes and is concerned about abdominal aortic aneurysm.  She states that this makes her quite anxious.  She is not having any pain or other symptoms.  She simply palpates an area on her lower abdomen that is pulsatile.  Patient believes that she needs further workup with imaging.  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   Hypertension    Anxiety     Social Hx   Social History   Socioeconomic History   Marital status: Divorced    Spouse name: Not on file   Number of children: 1   Years of education: COLLEGE   Highest education level: Not on file  Occupational History    Employer: [employer]  Tobacco Use   Smoking status: Former    Current packs/day: 0.00    Average packs/day: 1 pack/day for 21.0 years (21.0 ttl pk-yrs)    Types: Cigarettes    Start date: 06/23/1967    Quit date: 06/22/1988    Years since quitting: 35.2   Smokeless tobacco: Never   Tobacco comments:    stopped a long time ago  Substance and Sexual Activity   Alcohol use: No   Drug use: No   Sexual activity: Not Currently    Birth control/protection: Abstinence  Other Topics Concern    Not on file  Social History Narrative   Patient is right handed.   Patient drinks 2 cups caffeine daily   Social Drivers of Corporate investment banker Strain: Not on file  Food Insecurity: Not on file  Transportation Needs: Not on file  Physical Activity: Not on file  Stress: Not on file  Social Connections: Not on file    Review of Systems Per HPI  Objective:  BP 124/76   Pulse 78   Temp 98.1 F (36.7 C)   Ht 5\' 4"  (1.626 m)   Wt 137 lb (62.1 kg)   SpO2 96%   BMI 23.52 kg/m      09/20/2023    4:03 PM 06/30/2023    3:39 PM 05/25/2022   10:47 AM  BP/Weight  Systolic BP 124 120 138  Diastolic BP 76 72 74  Wt. (Lbs) 137 136.2 133  BMI 23.52 kg/m2 23.38 kg/m2 22.83 kg/m2    Physical Exam Vitals and nursing note reviewed.  Constitutional:      General: She is not in acute distress.    Appearance: Normal appearance.  HENT:     Head: Normocephalic and atraumatic.  Abdominal:     General: There is no distension.     Palpations: Abdomen is soft.     Comments: Palpable pulsation in  the mid abdomen.  Neurological:     Mental Status: She is alert.  Psychiatric:     Comments: Anxious.     Lab Results  Component Value Date   WBC 7.5 06/23/2021   HGB 13.9 06/23/2021   HCT 43.2 06/23/2021   PLT 376 06/23/2021   GLUCOSE 70 10/22/2020   CHOL 238 (H) 10/22/2020   TRIG 279 (H) 10/22/2020   HDL 82 10/22/2020   LDLCALC 109 (H) 10/22/2020   ALT 16 10/22/2020   AST 19 10/22/2020   NA 143 10/22/2020   K 4.6 10/22/2020   CL 101 10/22/2020   CREATININE 0.76 10/22/2020   BUN 17 10/22/2020   CO2 27 10/22/2020   TSH 0.756 04/06/2022   HGBA1C 5.8 (H) 04/06/2022     Assessment & Plan:  Pulsatile abdomen Assessment & Plan: Likely reflects a normal abdominal aorta in someone who is thin.  However, given age and the fact that this is disconcerting to her, proceeding with further evaluation with ultrasound imaging.  Orders: -     US AORTA   Follow-up:  Pending  [place]  Reisa Coppola DO [facility]

## 2023-09-20 NOTE — Patient Instructions (Signed)
 We will call with time/date of the [place] and get you the results as soon as we can.  Take care  Dr. Adriana Simas

## 2023-09-21 ENCOUNTER — Ambulatory Visit (HOSPITAL_COMMUNITY): Admission: RE | Admit: 2023-09-21 | Source: Ambulatory Visit

## 2023-09-24 ENCOUNTER — Ambulatory Visit (HOSPITAL_COMMUNITY)
Admission: RE | Admit: 2023-09-24 | Discharge: 2023-09-24 | Disposition: A | Discharge status: Home / Self Care | Source: Ambulatory Visit | Attending: Family Medicine | Admitting: Family Medicine

## 2023-09-24 DIAGNOSIS — R1900 Intra-abdominal and pelvic swelling, mass and lump, unspecified site: Secondary | ICD-10-CM | POA: Insufficient documentation

## 2023-09-24 DIAGNOSIS — R198 Other specified symptoms and signs involving the digestive system and abdomen: Secondary | ICD-10-CM | POA: Insufficient documentation

## 2024-03-09 ENCOUNTER — Other Ambulatory Visit: Payer: Self-pay | Admitting: Nurse Practitioner

## 2024-03-09 DIAGNOSIS — K219 Gastro-esophageal reflux disease without esophagitis: Secondary | ICD-10-CM

## 2024-03-14 ENCOUNTER — Other Ambulatory Visit: Payer: Self-pay | Admitting: Family Medicine

## 2024-03-14 DIAGNOSIS — I10 Essential (primary) hypertension: Secondary | ICD-10-CM

## 2024-03-14 DIAGNOSIS — R002 Palpitations: Secondary | ICD-10-CM

## 2024-06-02 ENCOUNTER — Ambulatory Visit

## 2024-06-02 VITALS — Ht 64.0 in | Wt 137.0 lb

## 2024-06-02 DIAGNOSIS — Z1211 Encounter for screening for malignant neoplasm of colon: Secondary | ICD-10-CM

## 2024-06-02 DIAGNOSIS — Z0000 Encounter for general adult medical examination without abnormal findings: Secondary | ICD-10-CM

## 2024-06-02 DIAGNOSIS — Z1231 Encounter for screening mammogram for malignant neoplasm of breast: Secondary | ICD-10-CM

## 2024-06-02 DIAGNOSIS — Z780 Asymptomatic menopausal state: Secondary | ICD-10-CM

## 2024-06-02 NOTE — Progress Notes (Signed)
 HM Addressed: Mammogram ordered DEXA ordered Cologuard Ordered  Chief Complaint  Patient presents with   Medicare Wellness     Subjective:   Yvonne [name] is a 68 y.o. female who presents for a Medicare Annual Wellness Visit.  Visit info / Clinical Intake: Medicare Wellness Visit Type:: Initial Annual Wellness Visit Persons participating in visit and providing information:: patient Medicare Wellness Visit Mode:: Telephone If telephone:: video declined Since this visit was completed virtually, some vitals may be partially provided or unavailable. Missing vitals are due to the limitations of the virtual format.: Documented vitals are patient reported If Telephone or Video please confirm:: I connected with patient using audio/video enable telemedicine. I verified patient identity with two identifiers, discussed telehealth limitations, and patient agreed to proceed. Patient Location:: home Provider Location:: home office Interpreter Needed?: No Pre-visit prep was completed: yes AWV questionnaire completed by patient prior to visit?: no Living arrangements:: (!) lives alone Patient's Overall Health Status Rating: very good Typical amount of pain: none Does pain affect daily life?: no Are you currently prescribed opioids?: no  Dietary Habits and Nutritional Risks How many meals a day?: 2 Most meals are obtained by: preparing own meals In the last 2 weeks, have you had any of the following?: none Diabetic:: no  Functional Status Activities of Daily Living (to include ambulation/medication): Independent Ambulation: Independent Medication Administration: Independent Home Management (perform basic housework or laundry): Independent Manage your own finances?: yes Primary transportation is: driving Concerns about vision?: no *vision screening is required for WTM* Concerns about hearing?: no  Fall Screening Falls in the past year?: 0 Number of falls in past year: 0 Was  there an injury with Fall?: 0 Fall Risk Category Calculator: 0 Patient Fall Risk Level: Low Fall Risk  Fall Risk Patient at Risk for Falls Due to: No Fall Risks Fall risk Follow up: Falls evaluation completed; Education provided; Falls prevention discussed  Home and Transportation Safety: All rugs have non-skid backing?: yes All stairs or steps have railings?: yes Grab bars in the bathtub or shower?: yes Have non-skid surface in bathtub or shower?: yes Good home lighting?: yes Regular seat belt use?: yes Hospital stays in the last year:: no  Cognitive Assessment Difficulty concentrating, remembering, or making decisions? : no Will 6CIT or Mini Cog be Completed: yes What year is it?: 0 points What month is it?: 0 points Give patient an address phrase to remember (5 components): [address] [place] About what time is it?: 0 points Count backwards from 20 to 1: 0 points Say the months of the year in reverse: 0 points Repeat the address phrase from earlier: 0 points 6 CIT Score: 0 points  Advance Directives (For Healthcare) Does Patient Have a Medical Advance Directive?: No Would patient like information on creating a medical advance directive?: No - Patient declined  Reviewed/Updated  Reviewed/Updated: Reviewed All (Medical, Surgical, Family, Medications, Allergies, Care Teams, Patient Goals)    Allergies (verified) Hydrocodone   Current Medications (verified) Outpatient Encounter Medications as of 06/02/2024  Medication Sig   ALPRAZolam  (XANAX ) 1 MG tablet TAKE 1/2 TO 1 TABLET BY MOUTH EVERY DAY AS NEEDED   esomeprazole  (NEXIUM ) 40 MG capsule TAKE 1 CAPSULE BY MOUTH EVERY DAY   metoprolol  tartrate (LOPRESSOR ) 50 MG tablet TAKE 1 TABLET BY MOUTH TWICE A DAY   Multiple Vitamin (MULTIVITAMIN) tablet Take 1 tablet by mouth daily.   No facility-administered encounter medications on file as of 06/02/2024.    History: Past  Medical History:  Diagnosis Date   Anxiety     Headache(784.0) 02/24/2013   Hypertension    Pneumothorax    Tachycardia    Past Surgical History:  Procedure Laterality Date   CHOLECYSTECTOMY     Family History  Problem Relation Age of Onset   Heart attack Father    Migraines Father    Heart attack Mother    Social History   Occupational History    Employer: [employer]  Tobacco Use   Smoking status: Former    Current packs/day: 0.00    Average packs/day: 1 pack/day for 21.0 years (21.0 ttl pk-yrs)    Types: Cigarettes    Start date: 06/23/1967    Quit date: 06/22/1988    Years since quitting: 35.9   Smokeless tobacco: Never   Tobacco comments:    stopped a long time ago  Substance and Sexual Activity   Alcohol use: No   Drug use: No   Sexual activity: Not Currently    Birth control/protection: Abstinence   Tobacco Counseling Counseling given: Yes Tobacco comments: stopped a long time ago  SDOH Screenings   Food Insecurity: No Food Insecurity (06/02/2024)  Housing: Low Risk (06/02/2024)  Transportation Needs: No Transportation Needs (06/02/2024)  Utilities: Not At Risk (06/02/2024)  Depression (PHQ2-9): Low Risk (06/02/2024)  Physical Activity: Patient Declined (06/02/2024)  Social Connections: Moderately Integrated (06/02/2024)  Stress: No Stress Concern Present (06/02/2024)  Tobacco Use: Medium Risk (06/02/2024)  Health Literacy: Adequate Health Literacy (06/02/2024)   See flowsheets for full screening details  Depression Screen PHQ 2 & 9 Depression Scale- Over the past 2 weeks, how often have you been bothered by any of the following problems? Little interest or pleasure in doing things: 0 Feeling down, depressed, or hopeless (PHQ Adolescent also includes...irritable): 2 PHQ-2 Total Score: 2 Trouble falling or staying asleep, or sleeping too much: 1 Feeling tired or having little energy: 0 Poor appetite or overeating (PHQ Adolescent also includes...weight loss): 0 Feeling bad about  yourself - or that you are a failure or have let yourself or your family down: 1 Trouble concentrating on things, such as reading the newspaper or watching television (PHQ Adolescent also includes...like school work): 0 Moving or speaking so slowly that other people could have noticed. Or the opposite - being so fidgety or restless that you have been moving around a lot more than usual: 0 Thoughts that you would be better off dead, or of hurting yourself in some way: 0 PHQ-9 Total Score: 4 If you checked off any problems, how difficult have these problems made it for you to do your work, take care of things at home, or get along with other people?: Somewhat difficult  Depression Treatment Depression Interventions/Treatment : Patient refuses Treatment     Goals Addressed               This Visit's Progress     I want to work on my mental health (pt-stated)               Objective:    Today's Vitals   06/02/24 1635  Weight: 137 lb (62.1 kg)  Height: 5' 4 (1.626 m)   Body mass index is 23.52 kg/m.  Hearing/Vision screen Hearing Screening - Comments:: Patient denies any hearing difficulties.   Vision Screening - Comments:: Patient is not up to date on yearly eye exams.  She will call and schedule the exam.  Immunizations and Health Maintenance Health Maintenance  Topic Date Due   Bone Density Scan  Never done   Medicare Annual Wellness (AWV)  Never done   Hepatitis C Screening  Never done   Fecal DNA (Cologuard)  Never done   Pneumococcal Vaccine: 50+ Years (1 of 1 - PCV) Never done   Zoster Vaccines- Shingrix (1 of 2) Never done   Mammogram  10/02/2022   Influenza Vaccine  Never done   COVID-19 Vaccine (3 - 2025-26 season) 02/21/2024   DTaP/Tdap/Td (2 - Td or Tdap) 06/04/2031   Meningococcal B Vaccine  Aged Out        Assessment/Plan:  This is a routine wellness examination for Kita.  Patient Care Team: Cook, Jayce G, DO as PCP - General (Family  Medicine) Oman, Heather , OD (Optometry)  I have personally reviewed and noted the following in the patients chart:   Medical and social history Use of alcohol, tobacco or illicit drugs  Current medications and supplements including opioid prescriptions. Functional ability and status Nutritional status Physical activity Advanced directives List of other physicians Hospitalizations, surgeries, and ER visits in previous 12 months Vitals Screenings to include cognitive, depression, and falls Referrals and appointments  Orders Placed This Encounter  Procedures   MM 3D SCREENING MAMMOGRAM BILATERAL BREAST    [facility] PF-10/01/21@[facility]  Family Medicine    Standing Status:   Future    Expiration Date:   06/02/2025    Reason for Exam (SYMPTOM  OR DIAGNOSIS REQUIRED):   breast cancer screening    Preferred imaging location?:   [facility]   DG Bone Density    Standing Status:   Future    Expiration Date:   06/02/2025    Reason for Exam (SYMPTOM  OR DIAGNOSIS REQUIRED):   post menopausal estrogen deficient    Preferred imaging location?:   [facility]   Cologuard   In addition, I have reviewed and discussed with patient certain preventive protocols, quality metrics, and best practice recommendations. A written personalized care plan for preventive services as well as general preventive health recommendations were provided to patient.   Taevyn Hausen, CMA   06/02/2024   Return Thursday June 07, 2025 at 1:00 pm, for your yearly Medicare Wellness Visit in person.  After Visit Summary: (Mail) Due to this being a telephonic visit, the after visit summary with patients personalized plan was offered to patient via mail

## 2024-06-02 NOTE — Patient Instructions (Addendum)
 Yvonne [name],  Thank you for taking the time for your Medicare Wellness Visit. I appreciate your continued commitment to your health goals. Please review the care plan we discussed, and feel free to reach out if I can assist you further.  Please note that Annual Wellness Visits do not include a physical exam. Some assessments may be limited, especially if the visit was conducted virtually. If needed, we may recommend an in-person follow-up with your provider.  Ongoing Care Seeing your primary care provider every 3 to 6 months helps us  monitor your health and provide consistent, personalized care.   1 year follow up for Medicare well visit: Thursday June 07, 2025 at 1:00 pm with medicare wellness nurse in office  Referrals If a referral was made during today's visit and you haven't received any updates within two weeks, please contact the referred provider directly to check on the status. Mammogram Order: Please call and make your appt or schedule it through my chart [facility] [address] [place], [place] [address] [phone] Osteoporosis Screening: Please call the number below to schedule your appt. [facility] Phone: [phone]  Cologuard (at home testing) Cologuard was ordered today.If you haven't received your kit in the next 2 weeks, please call: [employer] [phone].  Recommended Screenings:  Health Maintenance  Topic Date Due   Osteoporosis screening with Bone Density Scan  Never done   Medicare Annual Wellness Visit  Never done   Hepatitis C Screening  Never done   Cologuard (Stool DNA test)  Never done   Pneumococcal Vaccine for age over 29 (1 of 1 - PCV) Never done   Zoster (Shingles) Vaccine (1 of 2) Never done   Breast Cancer Screening  10/02/2022   Flu Shot  Never done   COVID-19 Vaccine (3 - 2025-26 season) 02/21/2024   DTaP/Tdap/Td vaccine (2 - Td or Tdap) 06/04/2031   Meningitis B Vaccine   Aged Out       06/02/2024    4:37 PM  Advanced Directives  Does Patient Have a Medical Advance Directive? No  Would patient like information on creating a medical advance directive? No - Patient declined    Vision: Annual vision screenings are recommended for early detection of glaucoma, cataracts, and diabetic retinopathy. These exams can also reveal signs of chronic conditions such as diabetes and high blood pressure.  Dental: Annual dental screenings help detect early signs of oral cancer, gum disease, and other conditions linked to overall health, including heart disease and diabetes.  Please see the attached documents for additional preventive care recommendations.

## 2024-06-05 ENCOUNTER — Ambulatory Visit: Admitting: Family Medicine

## 2024-06-05 VITALS — BP 138/83 | HR 83 | Temp 98.7°F | Ht 64.0 in | Wt 138.0 lb

## 2024-06-05 DIAGNOSIS — J019 Acute sinusitis, unspecified: Secondary | ICD-10-CM | POA: Insufficient documentation

## 2024-06-05 MED ORDER — AMOXICILLIN-POT CLAVULANATE 875-125 MG PO TABS: 1.0000 | ORAL_TABLET | Freq: Two times a day (BID) | ORAL | 0 refills | Status: AC

## 2024-06-05 MED ORDER — IPRATROPIUM BROMIDE 0.06 % NA SOLN: 2.0000 | Freq: Four times a day (QID) | NASAL | 0 refills | Status: AC | PRN

## 2024-06-05 NOTE — Assessment & Plan Note (Signed)
 Treating with Augmentin and Atrovent.

## 2024-06-05 NOTE — Progress Notes (Signed)
 Subjective:  Patient ID: Yvonne [name], female    DOB: 24-Jan-1956  Age: 68 y.o. MRN: [id number]  CC:   Chief Complaint  Patient presents with   sinus congestion with large amounts of nasal drainage    All day w/ long 3 weeks - color ranges white, brown, red Reports weakness and slight cough     HPI:  68 year old female presents for evaluation of the above.  3 week history of symptoms. She reports sneezing, rhinorrhea, congestion, PN drip. She has had some cough. No fever. She has had chills. Also reports she feels weak. No relief with OTC Robitussin.   Patient Active Problem List   Diagnosis Date Noted   Acute sinusitis 06/05/2024   Peripheral polyneuropathy 04/04/2022   Dense breasts 09/19/2021   Recurrent epistaxis 07/13/2021   Hyperlipidemia 04/17/2021   Gastroesophageal reflux disease without esophagitis 07/12/2020   Hypertension    Anxiety     Social Hx   Social History   Socioeconomic History   Marital status: Divorced    Spouse name: Not on file   Number of children: 1   Years of education: COLLEGE   Highest education level: Not on file  Occupational History    Employer: [employer]  Tobacco Use   Smoking status: Former    Current packs/day: 0.00    Average packs/day: 1 pack/day for 21.0 years (21.0 ttl pk-yrs)    Types: Cigarettes    Start date: 06/23/1967    Quit date: 06/22/1988    Years since quitting: 35.9   Smokeless tobacco: Never   Tobacco comments:    stopped a long time ago  Substance and Sexual Activity   Alcohol use: No   Drug use: No   Sexual activity: Not Currently    Birth control/protection: Abstinence  Other Topics Concern   Not on file  Social History Narrative   Patient is right handed.   Patient drinks 2 cups caffeine daily   Social Drivers of Health   Tobacco Use: Medium Risk (06/02/2024)   Patient History    Smoking Tobacco Use: Former    Smokeless Tobacco Use: Never    Passive Exposure: Not on Programmer, Applications Strain: Not on file  Food Insecurity: No Food Insecurity (06/02/2024)   Epic    Worried About Programme Researcher, Broadcasting/film/video in the Last Year: Never true    Ran Out of Food in the Last Year: Never true  Transportation Needs: No Transportation Needs (06/02/2024)   Epic    Lack of Transportation (Medical): No    Lack of Transportation (Non-Medical): No  Physical Activity: Patient Declined (06/02/2024)   Exercise Vital Sign    Days of Exercise per Week: Patient declined    Minutes of Exercise per Session: Patient declined  Stress: No Stress Concern Present (06/02/2024)   [employer] of Occupational Health - Occupational Stress Questionnaire    Feeling of Stress: Not at all  Social Connections: Moderately Integrated (06/02/2024)   Social Connection and Isolation Panel    Frequency of Communication with Friends and Family: More than three times a week    Frequency of Social Gatherings with Friends and Family: More than three times a week    Attends Religious Services: More than 4 times per year    Active Member of [employer] or Organizations: Yes    Attends Banker Meetings: More than 4 times per year    Marital Status: Divorced  Depression (PHQ2-9): Medium Risk (  06/05/2024)   Depression (PHQ2-9)    PHQ-2 Score: 6  Alcohol Screen: Not on file  Housing: Low Risk (06/02/2024)   Epic    Unable to Pay for Housing in the Last Year: No    Number of Times Moved in the Last Year: 0    Homeless in the Last Year: No  Utilities: Not At Risk (06/02/2024)   Epic    Threatened with loss of utilities: No  Health Literacy: Adequate Health Literacy (06/02/2024)   B1300 Health Literacy    Frequency of need for help with medical instructions: Never    Review of Systems Per HPI  Objective:  BP 138/83   Pulse 83   Temp 98.7 F (37.1 C)   Ht 5' 4 (1.626 m)   Wt 138 lb (62.6 kg)   SpO2 97%   BMI 23.69 kg/m      06/05/2024    4:22 PM 06/02/2024    4:35 PM  09/20/2023    4:03 PM  BP/Weight  Systolic BP 138 -- 124  Diastolic BP 83 -- 76  Wt. (Lbs) 138 137 137  BMI 23.69 kg/m2 23.52 kg/m2 23.52 kg/m2    Physical Exam Vitals and nursing note reviewed.  Constitutional:      General: She is not in acute distress.    Appearance: Normal appearance.  HENT:     Head: Normocephalic and atraumatic.     Right Ear: Tympanic membrane normal.     Left Ear: Tympanic membrane normal.     Nose: Congestion present.  Eyes:     General:        Right eye: No discharge.        Left eye: No discharge.     Conjunctiva/sclera: Conjunctivae normal.  Cardiovascular:     Rate and Rhythm: Normal rate and regular rhythm.  Pulmonary:     Effort: Pulmonary effort is normal.     Breath sounds: Normal breath sounds. No wheezing, rhonchi or rales.  Neurological:     Mental Status: She is alert.  Psychiatric:        Mood and Affect: Mood normal.        Behavior: Behavior normal.     Lab Results  Component Value Date   WBC 7.5 06/23/2021   HGB 13.9 06/23/2021   HCT 43.2 06/23/2021   PLT 376 06/23/2021   GLUCOSE 70 10/22/2020   CHOL 238 (H) 10/22/2020   TRIG 279 (H) 10/22/2020   HDL 82 10/22/2020   LDLCALC 109 (H) 10/22/2020   ALT 16 10/22/2020   AST 19 10/22/2020   NA 143 10/22/2020   K 4.6 10/22/2020   CL 101 10/22/2020   CREATININE 0.76 10/22/2020   BUN 17 10/22/2020   CO2 27 10/22/2020   TSH 0.756 04/06/2022   HGBA1C 5.8 (H) 04/06/2022     Assessment & Plan:  Acute sinusitis, recurrence not specified, unspecified location Assessment & Plan: Treating with Augmentin  and Atrovent .  Orders: -     Amoxicillin -Pot Clavulanate; Take 1 tablet by mouth 2 (two) times daily.  Dispense: 20 tablet; Refill: 0 -     Ipratropium Bromide ; Place 2 sprays into both nostrils 4 (four) times daily as needed for rhinitis.  Dispense: 15 mL; Refill: 0    Follow-up:  Return if symptoms worsen or fail to improve.  Jacqulyn Ahle DO [facility]

## 2024-06-07 ENCOUNTER — Other Ambulatory Visit: Payer: Self-pay | Admitting: Family Medicine

## 2024-06-07 DIAGNOSIS — F419 Anxiety disorder, unspecified: Secondary | ICD-10-CM

## 2024-06-08 ENCOUNTER — Other Ambulatory Visit: Payer: Self-pay | Admitting: Family Medicine

## 2024-06-08 DIAGNOSIS — K219 Gastro-esophageal reflux disease without esophagitis: Secondary | ICD-10-CM

## 2024-06-18 ENCOUNTER — Other Ambulatory Visit: Payer: Self-pay | Admitting: Family Medicine

## 2024-06-18 DIAGNOSIS — R002 Palpitations: Secondary | ICD-10-CM

## 2024-06-18 DIAGNOSIS — I10 Essential (primary) hypertension: Secondary | ICD-10-CM

## 2025-06-07 ENCOUNTER — Ambulatory Visit
